# Patient Record
Sex: Female | Born: 1990 | Race: Black or African American | Hispanic: No | Marital: Single | Smoking: Never smoker
Health system: Southern US, Community
[De-identification: ages and names within clinical notes are randomized; demographics above are authoritative.]

## PROBLEM LIST (undated history)

## (undated) DIAGNOSIS — F419 Anxiety disorder, unspecified: Secondary | ICD-10-CM

## (undated) DIAGNOSIS — J45909 Unspecified asthma, uncomplicated: Secondary | ICD-10-CM

## (undated) DIAGNOSIS — T7840XA Allergy, unspecified, initial encounter: Secondary | ICD-10-CM

## (undated) DIAGNOSIS — D649 Anemia, unspecified: Secondary | ICD-10-CM

## (undated) DIAGNOSIS — R569 Unspecified convulsions: Secondary | ICD-10-CM

## (undated) HISTORY — DX: Anemia, unspecified: D64.9

## (undated) HISTORY — DX: Allergy, unspecified, initial encounter: T78.40XA

## (undated) HISTORY — DX: Anxiety disorder, unspecified: F41.9

---

## 2012-09-08 ENCOUNTER — Encounter (HOSPITAL_COMMUNITY): Payer: Self-pay | Admitting: *Deleted

## 2012-09-08 ENCOUNTER — Emergency Department (HOSPITAL_COMMUNITY)
Admission: EM | Admit: 2012-09-08 | Discharge: 2012-09-09 | Disposition: A | Discharge status: Home / Self Care | Payer: Self-pay | Attending: Emergency Medicine | Admitting: Emergency Medicine

## 2012-09-08 DIAGNOSIS — J45909 Unspecified asthma, uncomplicated: Secondary | ICD-10-CM

## 2012-09-08 DIAGNOSIS — R509 Fever, unspecified: Secondary | ICD-10-CM | POA: Insufficient documentation

## 2012-09-08 DIAGNOSIS — J029 Acute pharyngitis, unspecified: Secondary | ICD-10-CM

## 2012-09-08 HISTORY — DX: Unspecified asthma, uncomplicated: J45.909

## 2012-09-08 LAB — RAPID STREP SCREEN (MED CTR MEBANE ONLY): Streptococcus, Group A Screen (Direct): NEGATIVE

## 2012-09-08 NOTE — ED Notes (Signed)
Pt c/o sore throat x 1 week, followed by fever, cough and nasal congestion.  Pt mildly febrile.

## 2012-09-09 MED ORDER — IBUPROFEN 800 MG PO TABS: 800.0000 mg | ORAL_TABLET | Freq: Three times a day (TID) | ORAL | Status: DC

## 2012-09-09 MED ORDER — HYDROCODONE-ACETAMINOPHEN 5-325 MG PO TABS: 1.0000 | ORAL_TABLET | ORAL | Status: DC | PRN

## 2012-09-09 MED ORDER — ALBUTEROL SULFATE HFA 108 (90 BASE) MCG/ACT IN AERS
2.0000 | INHALATION_SPRAY | RESPIRATORY_TRACT | Status: DC | PRN
  Administered 2012-09-09: 2 via RESPIRATORY_TRACT
  Filled 2012-09-09: qty 6.7

## 2012-09-09 MED ORDER — KETOROLAC TROMETHAMINE 60 MG/2ML IM SOLN
60.0000 mg | Freq: Once | INTRAMUSCULAR | Status: AC
  Administered 2012-09-09: 60 mg via INTRAMUSCULAR
  Filled 2012-09-09: qty 2

## 2012-09-09 NOTE — ED Provider Notes (Signed)
History     CSN: [id number]  Arrival date & time 09/08/12  2310   None     Chief Complaint  Patient presents with  . Sore Throat  . Fever    (Consider location/radiation/quality/duration/timing/severity/associated sxs/prior treatment) HPI History provided by pt.   Pt presents w/ c/o sore throat x 1 week.  No relief w/ nyquil.  Associated w/ rhinorrhea and cough as well as fever, max temp 100.5.  No known sick contacts.  Pt has a h/o asthma but is otherwise healthy.  Past Medical History  Diagnosis Date  . Asthma     History reviewed. No pertinent past surgical history.  No family history on file.  History  Substance Use Topics  . Smoking status: Not on file  . Smokeless tobacco: Not on file  . Alcohol Use:     OB History    Grav Para Term Preterm Abortions TAB SAB Ect Mult Living                  Review of Systems  All other systems reviewed and are negative.    Allergies  Banana and Latex  Home Medications   Current Outpatient Rx  Name  Route  Sig  Dispense  Refill  . ALBUTEROL SULFATE HFA 108 (90 BASE) MCG/ACT IN AERS   Inhalation   Inhale 2 puffs into the lungs every 6 (six) hours as needed. asthma         . ADULT MULTIVITAMIN W/MINERALS CH   Oral   Take 1 tablet by mouth daily.         Marland Kitchen HYDROCODONE-ACETAMINOPHEN 5-325 MG PO TABS   Oral   Take 1 tablet by mouth every 4 (four) hours as needed for pain.   20 tablet   0   . IBUPROFEN 800 MG PO TABS   Oral   Take 1 tablet (800 mg total) by mouth 3 (three) times daily.   12 tablet   0     BP 123/70  Pulse 103  Temp 99.7 F (37.6 C) (Oral)  Resp 18  SpO2 98%  LMP 08/24/2012  Physical Exam  Nursing note and vitals reviewed. Constitutional: She is oriented to person, place, and time. She appears well-developed and well-nourished. No distress.  HENT:  Head: Normocephalic and atraumatic.       Mild erythema of soft palate only.  No tonsillar edema or exudate.  No trismus and uvula  mid-line.   Eyes:       Normal appearance  Neck: Normal range of motion.  Cardiovascular: Normal rate and regular rhythm.   Pulmonary/Chest: Effort normal. No respiratory distress.       Breath sounds diminished on right side compared to left.  No wheezing.  Pt does not become dyspneic w/ ambulation.   Musculoskeletal: Normal range of motion.  Neurological: She is alert and oriented to person, place, and time.  Skin: Skin is warm and dry. No rash noted.  Psychiatric: She has a normal mood and affect. Her behavior is normal.    ED Course  Procedures (including critical care time)   Labs Reviewed  RAPID STREP SCREEN   No results found.   1. Viral pharyngitis   2. Asthma       MDM  21yo asthmatic F presents w/ 1 week of sore throat.  Based on centor criteria and negative strep obtained by triage nursing staff, strep pharyngitis unlikely.  Will treat symptomatically.  Pt received IM toradol in ED and  d/c'd home w/ 800mg  ibuprofen and vicodin.  On exam, breath sounds diminished on right side w/out wheezing or cough.  Pt reports SOB yesterday evening, but no CP, SOB, wheezing or cough currently.  Ambulated and pt did become dyspneic.  Provided her with an albuterol inhaler because she is out of hers and referred to [facility].  Return precautions discussed.         Otilio Miu, [place] 09/09/12 [phone]

## 2012-09-09 NOTE — ED Provider Notes (Signed)
Medical screening examination/treatment/procedure(s) were performed by non-physician practitioner and as supervising physician I was immediately available for consultation/collaboration.  Trygg Mantz, MD 09/09/12 0411 

## 2012-11-21 ENCOUNTER — Ambulatory Visit (INDEPENDENT_AMBULATORY_CARE_PROVIDER_SITE_OTHER): Payer: BC Managed Care – PPO | Admitting: Family Medicine

## 2012-11-21 VITALS — BP 107/65 | HR 89 | Temp 98.6°F | Resp 16 | Ht 61.5 in | Wt 135.0 lb

## 2012-11-21 DIAGNOSIS — N898 Other specified noninflammatory disorders of vagina: Secondary | ICD-10-CM

## 2012-11-21 DIAGNOSIS — R1031 Right lower quadrant pain: Secondary | ICD-10-CM

## 2012-11-21 DIAGNOSIS — B373 Candidiasis of vulva and vagina: Secondary | ICD-10-CM

## 2012-11-21 DIAGNOSIS — R110 Nausea: Secondary | ICD-10-CM

## 2012-11-21 DIAGNOSIS — R1032 Left lower quadrant pain: Secondary | ICD-10-CM

## 2012-11-21 DIAGNOSIS — R1110 Vomiting, unspecified: Secondary | ICD-10-CM

## 2012-11-21 DIAGNOSIS — B3731 Acute candidiasis of vulva and vagina: Secondary | ICD-10-CM

## 2012-11-21 DIAGNOSIS — N390 Urinary tract infection, site not specified: Secondary | ICD-10-CM

## 2012-11-21 DIAGNOSIS — K219 Gastro-esophageal reflux disease without esophagitis: Secondary | ICD-10-CM

## 2012-11-21 LAB — POCT CBC
Granulocyte percent: 59.5 %G (ref 37–80)
HCT, POC: 34.9 % — AB (ref 37.7–47.9)
Hemoglobin: 10.7 g/dL — AB (ref 12.2–16.2)
POC Granulocyte: 4.9 (ref 2–6.9)
RBC: 4.14 M/uL (ref 4.04–5.48)

## 2012-11-21 LAB — POCT UA - MICROSCOPIC ONLY: Casts, Ur, LPF, POC: NEGATIVE

## 2012-11-21 LAB — COMPREHENSIVE METABOLIC PANEL
ALT: 9 U/L (ref 0–35)
CO2: 26 mEq/L (ref 19–32)
Sodium: 137 mEq/L (ref 135–145)
Total Bilirubin: 0.7 mg/dL (ref 0.3–1.2)
Total Protein: 7.5 g/dL (ref 6.0–8.3)

## 2012-11-21 LAB — POCT URINALYSIS DIPSTICK
Bilirubin, UA: NEGATIVE
Glucose, UA: NEGATIVE
Nitrite, UA: NEGATIVE
pH, UA: 5.5

## 2012-11-21 LAB — POCT URINE PREGNANCY: Preg Test, Ur: NEGATIVE

## 2012-11-21 LAB — POCT WET PREP WITH KOH
KOH Prep POC: POSITIVE
Trichomonas, UA: NEGATIVE

## 2012-11-21 MED ORDER — ONDANSETRON 8 MG PO TBDP: 8.0000 mg | ORAL_TABLET | Freq: Three times a day (TID) | ORAL | Status: DC | PRN

## 2012-11-21 MED ORDER — OMEPRAZOLE 20 MG PO CPDR: 20.0000 mg | DELAYED_RELEASE_CAPSULE | Freq: Every day | ORAL | Status: DC

## 2012-11-21 MED ORDER — TERCONAZOLE 0.4 % VA CREA: 1.0000 | TOPICAL_CREAM | Freq: Every day | VAGINAL | Status: DC

## 2012-11-21 MED ORDER — FLUCONAZOLE 150 MG PO TABS: 150.0000 mg | ORAL_TABLET | Freq: Once | ORAL | Status: DC

## 2012-11-21 NOTE — Progress Notes (Signed)
[address]   [place], [place]  [address]   [phone]  Subjective:    Patient ID: Melinda Maldonado, female    DOB: 1991/10/18, 22 y.o.   MRN: [id number]  HPIThis 22 y.o. female presents for evaluation of abdominal pain and nausea.  Onset of acute abdominal pain this morning; +severe nausea this morning.  Has suffered with nausea for two weeks.  Breaking out on corners of mouth.  +chest heaviness/tightness for past two days.  Taking Amoxicillin for UTI; has been on four antibiotics for UTI without improvement since 08/2012.  Started on Macrobid but switched to Amoxicillin due to culture results.  Feeling a little better but feeling a little irritated on outside of vaginal region.  Onset of vomiting this morning; started vomiting at 7:00am; vomited x 2 today.  Vomit was a yellowish color; no bloody vomit; no black vomit.  Still having abdominal tightness now but not as severe as this morning; severe abdominal pain for 20 minutes this morning; became very hot with pain.  +diarrhea since starting Amoxicillin; has been taking Amoxicillin sporadically due to various symptoms; having diarrhea intermittently; no diarrhea today; +diarrhea yesterday.  +hot this morning; had to take off clothes and drink something cold.  No fever; no chills.  No headache.  No dysuria, frequency, urgency.  +Vaginal irritation.  Having a gray discharge currently.  +pelvic exam performed by PCP at last visit and swab obtained.  Sexually active; tested for STDs at recent visit by PCP and all negative.  LMP 11-02-13 normal.  Just started on Aviane OCP today; took first pill this morning; started today.  History of GERD; previous medication; after eating has nausea.  Recent visit with Huss[facility] at [facility] for acute symptoms.  +heartburn twice in past week.  Also on a fast currently; fasting since 11/11/12.  PCP:  Haynes Dage.  [facility].   Review of Systems  Constitutional: Positive for diaphoresis and appetite change. Negative for fever and  chills.  Respiratory: Positive for chest tightness. Negative for shortness of breath, wheezing and stridor.   Cardiovascular: Negative for chest pain.  Gastrointestinal: Positive for nausea, vomiting, abdominal pain, diarrhea and abdominal distention. Negative for constipation, blood in stool, anal bleeding and rectal pain.  Genitourinary: Positive for vaginal pain and pelvic pain. Negative for dysuria, urgency, frequency, hematuria, flank pain, vaginal bleeding, vaginal discharge, genital sores and menstrual problem.  Skin: Positive for rash.  Neurological: Negative for dizziness, light-headedness and headaches.        Past Medical History  Diagnosis Date  . Asthma   . Allergy   . Anemia   . Anxiety     No past surgical history on file.  Prior to Admission medications   Medication Sig Start Date End Date Taking? Authorizing Provider  albuterol (PROVENTIL HFA;VENTOLIN HFA) 108 (90 BASE) MCG/ACT inhaler Inhale 2 puffs into the lungs every 6 (six) hours as needed. asthma   Yes Historical Provider, MD  amoxicillin (AMOXIL) 875 MG tablet Take 875 mg by mouth 2 (two) times daily.   Yes Historical Provider, MD  montelukast (SINGULAIR) 10 MG tablet Take 10 mg by mouth at bedtime.   Yes Historical Provider, MD  Multiple Vitamin (MULTIVITAMIN WITH MINERALS) TABS Take 1 tablet by mouth daily.   Yes Historical Provider, MD  Vitamin D, Ergocalciferol, (DRISDOL) 50000 UNITS CAPS Take 50,000 Units by mouth.   Yes Historical Provider, MD    Allergies  Allergen Reactions  . Banana Hives  . Latex Itching  Rash, blistering and burning  . Sulfa Antibiotics     History   Social History  . Marital Status: Single    Spouse Name: N/A    Number of Children: N/A  . Years of Education: N/A   Occupational History  . Not on file.   Social History Main Topics  . Smoking status: Never Smoker   . Smokeless tobacco: Not on file  . Alcohol Use: No  . Drug Use: No  . Sexually Active: Not on  file   Other Topics Concern  . Not on file   Social History Narrative  . No narrative on file    Family History  Problem Relation Age of Onset  . Asthma Sister   . Asthma Daughter   . Cancer Paternal Grandmother     Objective:   Physical Exam  Nursing note and vitals reviewed. Constitutional: She is oriented to person, place, and time. She appears well-developed and well-nourished. No distress.  HENT:  Mouth/Throat: Oropharynx is clear and moist.  Eyes: Conjunctivae normal and EOM are normal. Pupils are equal, round, and reactive to light.  Neck: Normal range of motion. Neck supple. No thyromegaly present.  Cardiovascular: Normal rate, regular rhythm and normal heart sounds.   No murmur heard. Pulmonary/Chest: Effort normal and breath sounds normal. She has no wheezes. She has no rales.  Abdominal: Soft. Bowel sounds are normal. She exhibits no distension and no mass. There is no hepatosplenomegaly. There is tenderness in the right lower quadrant and left lower quadrant. There is no rigidity, no rebound, no guarding and no CVA tenderness.  Lymphadenopathy:    She has no cervical adenopathy.  Neurological: She is alert and oriented to person, place, and time. No cranial nerve deficit. She exhibits normal muscle tone. Coordination normal.  Skin: She is not diaphoretic.       Mild erythema B corners vermilion border of lips.  Psychiatric: She has a normal mood and affect. Her behavior is normal.   Results for orders placed in visit on 11/21/12  POCT CBC      Component Value Range   WBC 8.2  4.6 - 10.2 K/uL   Lymph, poc 2.9  0.6 - 3.4   POC LYMPH PERCENT 34.9  10 - 50 %L   MID (cbc) 0.5  0 - 0.9   POC MID % 5.6  0 - 12 %M   POC Granulocyte 4.9  2 - 6.9   Granulocyte percent 59.5  37 - 80 %G   RBC 4.14  4.04 - 5.48 M/uL   Hemoglobin 10.7 (*) 12.2 - 16.2 g/dL   HCT, POC 45.4 (*) 09.8 - 47.9 %   MCV 84.3  80 - 97 fL   MCH, POC 25.8 (*) 27 - 31.2 pg   MCHC 30.7 (*) 31.8 -  35.4 g/dL   RDW, POC 11.9     Platelet Count, POC 386  142 - 424 K/uL   MPV 8.9  0 - 99.8 fL  POCT URINE PREGNANCY      Component Value Range   Preg Test, Ur Negative    POCT URINALYSIS DIPSTICK      Component Value Range   Color, UA yellow     Clarity, UA clear     Glucose, UA neg     Bilirubin, UA neg     Ketones, UA neg     Spec Grav, UA >=1.030     Blood, UA neg     pH, UA 5.5  Protein, UA neg     Urobilinogen, UA 0.2     Nitrite, UA neg     Leukocytes, UA small (1+)    POCT UA - MICROSCOPIC ONLY      Component Value Range   WBC, Ur, HPF, POC 2-4     RBC, urine, microscopic 0-2     Bacteria, U Microscopic small     Mucus, UA small     Epithelial cells, urine per micros 3-6     Crystals, Ur, HPF, POC neg     Casts, Ur, LPF, POC neg     Yeast, UA neg    POCT WET PREP WITH KOH      Component Value Range   Trichomonas, UA Negative     Clue Cells Wet Prep HPF POC negative     Epithelial Wet Prep HPF POC 3-6     Yeast Wet Prep HPF POC positive     Bacteria Wet Prep HPF POC 1+     RBC Wet Prep HPF POC 0-1     WBC Wet Prep HPF POC 3-6     KOH Prep POC Positive          Assessment & Plan:   1. Nausea  POCT CBC, POCT urine pregnancy, POCT urinalysis dipstick, POCT UA - Microscopic Only, Urine culture, Comprehensive metabolic panel, ondansetron (ZOFRAN-ODT) 8 MG disintegrating tablet  2. Vaginal itching  POCT Wet Prep with KOH  3. Candidiasis of vagina  fluconazole (DIFLUCAN) 150 MG tablet, terconazole (TERAZOL 7) 0.4 % vaginal cream  4. UTI (lower urinary tract infection)    5. Vomiting    6. GERD (gastroesophageal reflux disease)  omeprazole (PRILOSEC) 20 MG capsule  7. Abdominal pain, acute, bilateral lower quadrant      1. Abdominal pain/pelvic pain:  New.  Benign exam.  With associated nausea and vomiting.  BRAT diet.  Urine pregnancy negative and WBC count normal.  RTC for acute worsening. 2.  Nausea with vomiting:  New; worsening with initiation of OCP this  morning.  BRAT diet, hydration.  Rx for Zofran 8mg  PRN provided.  RTC for persistence.  Recommend switching OCP to after breakfast; if nausea persists, recommend switching OCP to qhs. 3.  Candidiasis vaginal:  New.  Rx for Diflucan and Terazol provided; likely secondary to multiple antibiotics. S/p recent STD screening by PCP by report and negative. 4.  UTI:  New to this provider.  Persistent since 08/2012 per patient; send urine culture; complete Amoxicillin. 5.  GERD:  New.  Rx for Omeprazole 20mg  daily for next 2-4 weeks.     Meds ordered this encounter  Medications  . montelukast (SINGULAIR) 10 MG tablet    Sig: Take 10 mg by mouth at bedtime.  Marland Kitchen amoxicillin (AMOXIL) 875 MG tablet    Sig: Take 875 mg by mouth 2 (two) times daily.  . Vitamin D, Ergocalciferol, (DRISDOL) 50000 UNITS CAPS    Sig: Take 50,000 Units by mouth.  . fluconazole (DIFLUCAN) 150 MG tablet    Sig: Take 1 tablet (150 mg total) by mouth once. Repeat if needed    Dispense:  2 tablet    Refill:  0  . terconazole (TERAZOL 7) 0.4 % vaginal cream    Sig: Place 1 applicator vaginally at bedtime.    Dispense:  45 g    Refill:  0  . omeprazole (PRILOSEC) 20 MG capsule    Sig: Take 1 capsule (20 mg total) by mouth daily.    Dispense:  30 capsule    Refill:  3  . ondansetron (ZOFRAN-ODT) 8 MG disintegrating tablet    Sig: Take 1 tablet (8 mg total) by mouth every 8 (eight) hours as needed for nausea.    Dispense:  30 tablet    Refill:  0

## 2012-11-21 NOTE — Patient Instructions (Addendum)
1. Nausea  POCT CBC, POCT urine pregnancy, POCT urinalysis dipstick, POCT UA - Microscopic Only, Urine culture, Comprehensive metabolic panel, ondansetron (ZOFRAN-ODT) 8 MG disintegrating tablet  2. Vaginal itching  POCT Wet Prep with KOH  3. Candidiasis of vagina  fluconazole (DIFLUCAN) 150 MG tablet, terconazole (TERAZOL 7) 0.4 % vaginal cream  4. UTI (lower urinary tract infection)    5. Vomiting    6. GERD (gastroesophageal reflux disease)  omeprazole (PRILOSEC) 20 MG capsule  7. Abdominal pain, acute, bilateral lower quadrant

## 2012-11-22 LAB — URINE CULTURE

## 2012-12-08 ENCOUNTER — Encounter (HOSPITAL_COMMUNITY): Payer: Self-pay

## 2012-12-08 ENCOUNTER — Emergency Department (HOSPITAL_COMMUNITY): Payer: Self-pay

## 2012-12-08 ENCOUNTER — Emergency Department (HOSPITAL_COMMUNITY)
Admission: EM | Admit: 2012-12-08 | Discharge: 2012-12-08 | Disposition: A | Discharge status: Home / Self Care | Payer: Self-pay | Attending: Emergency Medicine | Admitting: Emergency Medicine

## 2012-12-08 DIAGNOSIS — R112 Nausea with vomiting, unspecified: Secondary | ICD-10-CM | POA: Insufficient documentation

## 2012-12-08 DIAGNOSIS — Z79899 Other long term (current) drug therapy: Secondary | ICD-10-CM | POA: Insufficient documentation

## 2012-12-08 DIAGNOSIS — M545 Low back pain, unspecified: Secondary | ICD-10-CM | POA: Insufficient documentation

## 2012-12-08 DIAGNOSIS — N938 Other specified abnormal uterine and vaginal bleeding: Secondary | ICD-10-CM | POA: Insufficient documentation

## 2012-12-08 DIAGNOSIS — J45909 Unspecified asthma, uncomplicated: Secondary | ICD-10-CM | POA: Insufficient documentation

## 2012-12-08 DIAGNOSIS — Z8659 Personal history of other mental and behavioral disorders: Secondary | ICD-10-CM | POA: Insufficient documentation

## 2012-12-08 DIAGNOSIS — Z3202 Encounter for pregnancy test, result negative: Secondary | ICD-10-CM | POA: Insufficient documentation

## 2012-12-08 DIAGNOSIS — R109 Unspecified abdominal pain: Secondary | ICD-10-CM

## 2012-12-08 DIAGNOSIS — R1012 Left upper quadrant pain: Secondary | ICD-10-CM | POA: Insufficient documentation

## 2012-12-08 DIAGNOSIS — M549 Dorsalgia, unspecified: Secondary | ICD-10-CM

## 2012-12-08 DIAGNOSIS — Z862 Personal history of diseases of the blood and blood-forming organs and certain disorders involving the immune mechanism: Secondary | ICD-10-CM | POA: Insufficient documentation

## 2012-12-08 DIAGNOSIS — N949 Unspecified condition associated with female genital organs and menstrual cycle: Secondary | ICD-10-CM | POA: Insufficient documentation

## 2012-12-08 LAB — URINALYSIS, ROUTINE W REFLEX MICROSCOPIC
Ketones, ur: NEGATIVE mg/dL
Leukocytes, UA: NEGATIVE
Nitrite: NEGATIVE
Protein, ur: NEGATIVE mg/dL
pH: 6 (ref 5.0–8.0)

## 2012-12-08 LAB — GC/CHLAMYDIA PROBE AMP: CT Probe RNA: NEGATIVE

## 2012-12-08 LAB — CBC WITH DIFFERENTIAL/PLATELET
Basophils Absolute: 0 10*3/uL (ref 0.0–0.1)
Basophils Relative: 0 % (ref 0–1)
MCHC: 32.5 g/dL (ref 30.0–36.0)
Neutro Abs: 3.6 10*3/uL (ref 1.7–7.7)
Neutrophils Relative %: 49 % (ref 43–77)
RDW: 14.9 % (ref 11.5–15.5)

## 2012-12-08 LAB — WET PREP, GENITAL: Yeast Wet Prep HPF POC: NONE SEEN

## 2012-12-08 LAB — COMPREHENSIVE METABOLIC PANEL
ALT: 10 U/L (ref 0–35)
Albumin: 3.6 g/dL (ref 3.5–5.2)
Alkaline Phosphatase: 68 U/L (ref 39–117)
BUN: 12 mg/dL (ref 6–23)
Potassium: 3.8 mEq/L (ref 3.5–5.1)
Sodium: 140 mEq/L (ref 135–145)
Total Protein: 6.9 g/dL (ref 6.0–8.3)

## 2012-12-08 LAB — URINE MICROSCOPIC-ADD ON

## 2012-12-08 MED ORDER — GI COCKTAIL ~~LOC~~
30.0000 mL | Freq: Once | ORAL | Status: AC
  Administered 2012-12-08: 30 mL via ORAL
  Filled 2012-12-08: qty 30

## 2012-12-08 MED ORDER — HYDROCODONE-ACETAMINOPHEN 5-325 MG PO TABS: 1.0000 | ORAL_TABLET | ORAL | Status: DC | PRN

## 2012-12-08 MED ORDER — PROMETHAZINE HCL 25 MG PO TABS: 25.0000 mg | ORAL_TABLET | Freq: Four times a day (QID) | ORAL | Status: DC | PRN

## 2012-12-08 MED ORDER — IBUPROFEN 800 MG PO TABS
800.0000 mg | ORAL_TABLET | Freq: Once | ORAL | Status: AC
  Administered 2012-12-08: 800 mg via ORAL
  Filled 2012-12-08: qty 1

## 2012-12-08 MED ORDER — FAMOTIDINE 20 MG PO TABS: 20.0000 mg | ORAL_TABLET | Freq: Two times a day (BID) | ORAL | Status: DC

## 2012-12-08 NOTE — ED Provider Notes (Signed)
Medical screening examination/treatment/procedure(s) were performed by non-physician practitioner and as supervising physician I was immediately available for consultation/collaboration.  Sunnie Nielsen, MD 12/08/12 [phone]

## 2012-12-08 NOTE — ED Notes (Signed)
Pt states that she has been having heavy menstrual bleeding and that it doesn't feel like a normal menstrual cycle. Pt states that the pain is in her upper right back, and lower right abdomen. Pt states she has to sleep on pillows to aleviate pain.

## 2012-12-08 NOTE — ED Notes (Signed)
Patient presents with bilateral flank pain, lower back pain and abdominal pain x 2 weeks. Was treated by PCP for UTI about 1.5 weeks ago with antibiotics, norco and zofran. Denies any current urinary sx (frequency, urgency, discharge, odor). No fever, sweats or chills. LMP 12/03/12

## 2012-12-08 NOTE — ED Provider Notes (Signed)
History     CSN: [id number]  Arrival date & time 12/08/12  0111   First MD Initiated Contact with Patient 12/08/12 0116      Chief Complaint  Patient presents with  . Abdominal Pain  . Flank Pain    (Consider location/radiation/quality/duration/timing/severity/associated sxs/prior treatment) HPI History provided by pt.   Pt has had intermittent, sharp, non-radiating pain in epigastrium for the past 6 days.  Moves to either one of her sides when she is laying on that side in bed.  Not affected by eating.  No urinary sx or change in bowels. Has had constant pain in mid-back that started around the same time.  No aggravating/alleviating factors.  No associated fever, cough, CP or LE edema/pain but has had worse than baseline SOB for the past 2 weeks.  No RF for PE.  Denies trauma/heavy lifting.  Has been experiencing am and evening nausea for the past 4 month; single episode of vomiting several days ago.  Has been evaluated by her PCP and [facility] and etiology unknown. 5-6 days ago she developed heavy vaginal bleeding.  Has to change her pad/tampon q2-3 hours.  Denies fever, diarrhea, urinary and other vaginal sx.  Has felt lightheaded w/ standing recently.  Missed her period in December, which has never happened in the past.  Was started on birth control two weeks ago, took a single pill and then forgot to take anymore of them.    Past Medical History  Diagnosis Date  . Asthma   . Allergy   . Anemia   . Anxiety     History reviewed. No pertinent past surgical history.  Family History  Problem Relation Age of Onset  . Asthma Sister   . Asthma Daughter   . Cancer Paternal Grandmother     History  Substance Use Topics  . Smoking status: Never Smoker   . Smokeless tobacco: Not on file  . Alcohol Use: No    OB History    Grav Para Term Preterm Abortions TAB SAB Ect Mult Living                  Review of Systems  All other systems reviewed and are  negative.    Allergies  Banana; Latex; and Sulfa antibiotics  Home Medications   Current Outpatient Rx  Name  Route  Sig  Dispense  Refill  . ALBUTEROL SULFATE HFA 108 (90 BASE) MCG/ACT IN AERS   Inhalation   Inhale 2 puffs into the lungs every 6 (six) hours as needed. asthma         . HYDROCODONE-ACETAMINOPHEN 5-325 MG PO TABS   Oral   Take 1 tablet by mouth every 6 (six) hours as needed. For pain         . MONTELUKAST SODIUM 10 MG PO TABS   Oral   Take 10 mg by mouth at bedtime.         . ADULT MULTIVITAMIN W/MINERALS CH   Oral   Take 1 tablet by mouth daily.         Marland Kitchen OMEPRAZOLE 20 MG PO CPDR   Oral   Take 1 capsule (20 mg total) by mouth daily.   30 capsule   3   . ONDANSETRON 8 MG PO TBDP   Oral   Take 1 tablet (8 mg total) by mouth every 8 (eight) hours as needed for nausea.   30 tablet   0   . VITAMIN D (ERGOCALCIFEROL) 50000  UNITS PO CAPS   Oral   Take 50,000 Units by mouth every 7 (seven) days. On Tuesdays           BP 115/61  Pulse 68  Temp 98.3 F (36.8 C) (Oral)  Resp 16  SpO2 100%  LMP 12/03/2012  Physical Exam  Nursing note and vitals reviewed. Constitutional: She is oriented to person, place, and time. She appears well-developed and well-nourished. No distress.  HENT:  Head: Normocephalic and atraumatic.  Eyes:       Normal appearance  Neck: Normal range of motion.  Cardiovascular: Normal rate and regular rhythm.   Pulmonary/Chest: Effort normal and breath sounds normal. No respiratory distress.       Denies pleuritic pain in right mid-back  Abdominal: Soft. Bowel sounds are normal. She exhibits no distension and no mass. There is no rebound and no guarding.       Mild, diffuse ttp, most prominent in epigastrium on initial exam.  Isolated LUQ ttp on repeat exam.   Genitourinary:       No CVA tenderness.  Nml external genitalia.  Small amount of vaginal bleeding.  Cervix closed and appears nml.  No adnexal or cervical motion  tenderess.   Musculoskeletal: Normal range of motion.       Mild tenderness of thoracic spine and right paraspinal muscles.  Pain w/ twisting torso.  No LE edema/ttp.   Neurological: She is alert and oriented to person, place, and time.  Skin: Skin is warm and dry. No rash noted.  Psychiatric: She has a normal mood and affect. Her behavior is normal.    ED Course  Procedures (including critical care time)  Labs Reviewed  URINALYSIS, ROUTINE W REFLEX MICROSCOPIC - Abnormal; Notable for the following:    Hgb urine dipstick MODERATE (*)     All other components within normal limits  CBC WITH DIFFERENTIAL - Abnormal; Notable for the following:    RBC 3.84 (*)     Hemoglobin 10.2 (*)     HCT 31.4 (*)     All other components within normal limits  COMPREHENSIVE METABOLIC PANEL - Abnormal; Notable for the following:    Total Bilirubin 0.2 (*)     All other components within normal limits  WET PREP, GENITAL - Abnormal; Notable for the following:    Clue Cells Wet Prep HPF POC FEW (*)     WBC, Wet Prep HPF POC MODERATE (*)     All other components within normal limits  URINE MICROSCOPIC-ADD ON - Abnormal; Notable for the following:    Squamous Epithelial / LPF FEW (*)     All other components within normal limits  LIPASE, BLOOD  POCT PREGNANCY, URINE  GC/CHLAMYDIA PROBE AMP   Dg Chest 2 View  12/08/2012  *RADIOLOGY REPORT*  Clinical Data: Shortness of breath and asthma.  CHEST - 2 VIEW  Comparison: None.  Findings: Normal inspiration. The heart size and pulmonary vascularity are normal. The lungs appear clear and expanded without focal air space disease or consolidation. No blunting of the costophrenic angles.  No pneumothorax.  Mediastinal contours are intact.  IMPRESSION: No evidence of active pulmonary disease.   Original Report Authenticated By: Burman Nieves, M.D.      1. Abdominal pain   2. Back pain   3. Dysfunctional uterine bleeding   4. Nausea and vomiting       MDM   21yo F w/ h/o asthma and anemia, presents w/ multiple complaints including constant mid-back  and intermittent epigastric pain x several days, am and evening nausea x 4 months and irregular periods, currently w/ atypically heavy vaginal bleeding.  Doubt PE; no risk factors, back pain non-pleuritic and reproducible w/ palpation and twisting of torso, no signs of DVT and VS w/in nml range.  CXR neg for pneumonia.  Will treat symptomatically for muscle strain.  Mild LUQ tenderness but otherwise benign abd on exam.  Labs unremarkable.  Likely has gastritis.  Low suspicion for cholelithiasis based on description of pain and no RUQ tenderness.  Has taken omeprazole in past w/out relief.  Prescribed pepcid and promethazine.  Referred to GI for chronic nausea.  Nml genitalia exam w/ exception of small amt of vaginal bleeding.  Pt reports that bleeding has lightened today so will not prescribe provera.  Advised close f/u with gynecologist.  Return precautions discussed. 5:34 AM        Otilio Miu, PA-C 12/08/12 [phone]

## 2012-12-08 NOTE — ED Notes (Signed)
Pt unable to void at this time. 

## 2013-01-05 ENCOUNTER — Inpatient Hospital Stay (HOSPITAL_COMMUNITY)
Admission: AD | Admit: 2013-01-05 | Discharge: 2013-01-05 | Disposition: A | Discharge status: Home / Self Care | Payer: BC Managed Care – PPO | Source: Ambulatory Visit | Attending: Obstetrics and Gynecology | Admitting: Obstetrics and Gynecology

## 2013-01-05 ENCOUNTER — Inpatient Hospital Stay (HOSPITAL_COMMUNITY): Payer: BC Managed Care – PPO

## 2013-01-05 ENCOUNTER — Encounter (HOSPITAL_COMMUNITY): Payer: Self-pay | Admitting: *Deleted

## 2013-01-05 DIAGNOSIS — R109 Unspecified abdominal pain: Secondary | ICD-10-CM

## 2013-01-05 DIAGNOSIS — N926 Irregular menstruation, unspecified: Secondary | ICD-10-CM

## 2013-01-05 LAB — URINE MICROSCOPIC-ADD ON

## 2013-01-05 LAB — URINALYSIS, ROUTINE W REFLEX MICROSCOPIC
Nitrite: NEGATIVE
Protein, ur: NEGATIVE mg/dL
Specific Gravity, Urine: <1.005 — ABNORMAL LOW (ref 1.005–1.030)
Urobilinogen, UA: 0.2 mg/dL (ref 0.0–1.0)

## 2013-01-05 LAB — CBC
MCHC: 32.1 g/dL (ref 30.0–36.0)
Platelets: 388 10*3/uL (ref 150–400)
RDW: 14.7 % (ref 11.5–15.5)
WBC: 8.1 10*3/uL (ref 4.0–10.5)

## 2013-01-05 LAB — POCT PREGNANCY, URINE: Preg Test, Ur: NEGATIVE

## 2013-01-05 MED ORDER — KETOROLAC TROMETHAMINE 60 MG/2ML IM SOLN
60.0000 mg | Freq: Once | INTRAMUSCULAR | Status: AC
  Administered 2013-01-05: 60 mg via INTRAMUSCULAR
  Filled 2013-01-05: qty 2

## 2013-01-05 NOTE — MAU Note (Signed)
Vaginal bleeding for the last 8 days with pelvic pain and back pain. HPT negative. Sent from urgent care to be evaluated.

## 2013-01-05 NOTE — MAU Provider Note (Signed)
History     CSN: [id number]  Arrival date and time: 01/05/13 2016   None     Chief Complaint  Patient presents with  . Vaginal Bleeding  . Abdominal Cramping   HPI  Melinda Maldonado is a 22 y.o. [id number] who presents today with vaginal bleeding X 8 days, and cramps. She was sent over from urgent care. She states her primary doctor has sent her to the neurologist and the gastroenterologist to see if the lower abdominal pain and back pain could have been attributed to either of those areas.   Past Medical History  Diagnosis Date  . Asthma   . Allergy   . Anemia   . Anxiety     History reviewed. No pertinent past surgical history.  Family History  Problem Relation Age of Onset  . Asthma Sister   . Asthma Daughter   . Cancer Paternal Grandmother     History  Substance Use Topics  . Smoking status: Never Smoker   . Smokeless tobacco: Not on file  . Alcohol Use: No    Allergies:  Allergies  Allergen Reactions  . Keflex (Cephalexin) Anaphylaxis  . Banana Hives  . Eggs Or Egg-Derived Products Other (See Comments)    Body gets hot  . Latex Itching    Rash, blistering and burning  . Sulfa Antibiotics     Prescriptions prior to admission  Medication Sig Dispense Refill  . albuterol (PROVENTIL HFA;VENTOLIN HFA) 108 (90 BASE) MCG/ACT inhaler Inhale 2 puffs into the lungs every 6 (six) hours as needed. asthma      . aspirin 325 MG tablet Take 325 mg by mouth daily.      Marland Kitchen ibuprofen (ADVIL,MOTRIN) 400 MG tablet Take 400 mg by mouth every 6 (six) hours as needed for pain.      . montelukast (SINGULAIR) 10 MG tablet Take 10 mg by mouth at bedtime.      Marland Kitchen omeprazole (PRILOSEC) 20 MG capsule Take 1 capsule (20 mg total) by mouth daily.  30 capsule  3  . Vitamin D, Ergocalciferol, (DRISDOL) 50000 UNITS CAPS Take 50,000 Units by mouth every 7 (seven) days. On Tuesdays      . Multiple Vitamin (MULTIVITAMIN WITH MINERALS) TABS Take 1 tablet by mouth daily.      . ondansetron  (ZOFRAN-ODT) 8 MG disintegrating tablet Take 1 tablet (8 mg total) by mouth every 8 (eight) hours as needed for nausea.  30 tablet  0  . [DISCONTINUED] famotidine (PEPCID) 20 MG tablet Take 1 tablet (20 mg total) by mouth 2 (two) times daily.  20 tablet  0  . [DISCONTINUED] HYDROcodone-acetaminophen (NORCO/VICODIN) 5-325 MG per tablet Take 1 tablet by mouth every 6 (six) hours as needed. For pain      . [DISCONTINUED] HYDROcodone-acetaminophen (NORCO/VICODIN) 5-325 MG per tablet Take 1 tablet by mouth every 4 (four) hours as needed for pain.  20 tablet  0  . [DISCONTINUED] promethazine (PHENERGAN) 25 MG tablet Take 1 tablet (25 mg total) by mouth every 6 (six) hours as needed for nausea.  20 tablet  0    ROS Physical Exam   Blood pressure 116/82, pulse 86, temperature 97.9 F (36.6 C), temperature source Oral, resp. rate 18, height 4' 11.5" (1.511 m), weight 137 lb 8 oz (62.37 kg), last menstrual period 12/16/2012.  Physical Exam  Constitutional: She is oriented to person, place, and time. She appears well-developed and well-nourished. No distress.  Cardiovascular: Normal rate.   Respiratory: Effort normal.  Neurological: She is alert and oriented to person, place, and time.  Skin: Skin is warm and dry.  Psychiatric: She has a normal mood and affect.    MAU Course  Procedures  Results for orders placed during the hospital encounter of 01/05/13 (from the past 24 hour(s))  URINALYSIS, ROUTINE W REFLEX MICROSCOPIC     Status: Abnormal   Collection Time    01/05/13  8:20 PM      Result Value Range   Color, Urine YELLOW  YELLOW   APPearance CLEAR  CLEAR   Specific Gravity, Urine <1.005 (*) 1.005 - 1.030   pH 6.0  5.0 - 8.0   Glucose, UA NEGATIVE  NEGATIVE mg/dL   Hgb urine dipstick NEGATIVE  NEGATIVE   Bilirubin Urine NEGATIVE  NEGATIVE   Ketones, ur NEGATIVE  NEGATIVE mg/dL   Protein, ur NEGATIVE  NEGATIVE mg/dL   Urobilinogen, UA 0.2  0.0 - 1.0 mg/dL   Nitrite NEGATIVE  NEGATIVE    Leukocytes, UA MODERATE (*) NEGATIVE  URINE MICROSCOPIC-ADD ON     Status: Abnormal   Collection Time    01/05/13  8:20 PM      Result Value Range   Squamous Epithelial / LPF RARE  RARE   WBC, UA 3-6  <3 WBC/hpf   RBC / HPF 0-2  <3 RBC/hpf   Bacteria, UA FEW (*) RARE  CBC     Status: Abnormal   Collection Time    01/05/13  8:30 PM      Result Value Range   WBC 8.1  4.0 - 10.5 K/uL   RBC 3.88  3.87 - 5.11 MIL/uL   Hemoglobin 10.2 (*) 12.0 - 15.0 g/dL   HCT 47.8 (*) 29.5 - 62.1 %   MCV 82.0  78.0 - 100.0 fL   MCH 26.3  26.0 - 34.0 pg   MCHC 32.1  30.0 - 36.0 g/dL   RDW 30.8  65.7 - 84.6 %   Platelets 388  150 - 400 K/uL  POCT PREGNANCY, URINE     Status: None   Collection Time    01/05/13  8:45 PM      Result Value Range   Preg Test, Ur NEGATIVE  NEGATIVE   *RADIOLOGY REPORT*  Clinical Data: Abnormal uterine bleeding.  TRANSABDOMINAL AND TRANSVAGINAL ULTRASOUND OF PELVIS  Technique: Both transabdominal and transvaginal ultrasound  examinations of the pelvis were performed. Transabdominal technique  was performed for global imaging of the pelvis including uterus,  ovaries, adnexal regions, and pelvic cul-de-sac.  It was necessary to proceed with endovaginal exam following the  transabdominal exam to visualize the uterus and endometrium.  Comparison: None  Findings:  Uterus: Uterus is normal and size and echotexture measuring 7.9 x  3.8 x 4.9 cm.  Endometrium: Uniformly thin at 2 mm.  Right ovary: Normal in size with small follicles measuring 3.1 x  2.1 x 2.6 cm.  Left ovary: Normal in size of small follicles measuring 2.3 x 1.4 x  1.6 cm.  Other findings: No free fluid  IMPRESSION:  Normal uterus and ovaries.  Original Report Authenticated By: Genevive Bi, M.D.   Assessment and Plan   1. Irregular menstrual cycle    FU with PCP Keep a menstrual calendar   Tawnya Crook 01/05/2013, 9:53 PM

## 2013-01-07 LAB — URINE CULTURE: Colony Count: 6000

## 2013-01-09 NOTE — MAU Provider Note (Signed)
Attestation of Attending Supervision of Advanced Practitioner: Evaluation and management procedures were performed by the PA/NP/CNM/OB Fellow under my supervision/collaboration. Chart reviewed and agree with management and plan.  FERGUSON,JOHN V 01/09/2013 8:40 PM   

## 2013-03-31 ENCOUNTER — Emergency Department (HOSPITAL_BASED_OUTPATIENT_CLINIC_OR_DEPARTMENT_OTHER)
Admission: EM | Admit: 2013-03-31 | Discharge: 2013-04-01 | Disposition: A | Discharge status: Home / Self Care | Payer: BC Managed Care – PPO | Attending: Emergency Medicine | Admitting: Emergency Medicine

## 2013-03-31 ENCOUNTER — Encounter (HOSPITAL_BASED_OUTPATIENT_CLINIC_OR_DEPARTMENT_OTHER): Payer: Self-pay

## 2013-03-31 DIAGNOSIS — Z79899 Other long term (current) drug therapy: Secondary | ICD-10-CM | POA: Insufficient documentation

## 2013-03-31 DIAGNOSIS — Z3202 Encounter for pregnancy test, result negative: Secondary | ICD-10-CM | POA: Insufficient documentation

## 2013-03-31 DIAGNOSIS — R102 Pelvic and perineal pain: Secondary | ICD-10-CM

## 2013-03-31 DIAGNOSIS — Z91040 Latex allergy status: Secondary | ICD-10-CM | POA: Insufficient documentation

## 2013-03-31 DIAGNOSIS — Z7982 Long term (current) use of aspirin: Secondary | ICD-10-CM | POA: Insufficient documentation

## 2013-03-31 DIAGNOSIS — F411 Generalized anxiety disorder: Secondary | ICD-10-CM | POA: Insufficient documentation

## 2013-03-31 DIAGNOSIS — Z862 Personal history of diseases of the blood and blood-forming organs and certain disorders involving the immune mechanism: Secondary | ICD-10-CM | POA: Insufficient documentation

## 2013-03-31 DIAGNOSIS — J45909 Unspecified asthma, uncomplicated: Secondary | ICD-10-CM | POA: Insufficient documentation

## 2013-03-31 DIAGNOSIS — N949 Unspecified condition associated with female genital organs and menstrual cycle: Secondary | ICD-10-CM | POA: Insufficient documentation

## 2013-03-31 DIAGNOSIS — R110 Nausea: Secondary | ICD-10-CM | POA: Insufficient documentation

## 2013-03-31 LAB — URINALYSIS, ROUTINE W REFLEX MICROSCOPIC
Glucose, UA: NEGATIVE mg/dL
Hgb urine dipstick: NEGATIVE
Ketones, ur: NEGATIVE mg/dL
Protein, ur: NEGATIVE mg/dL
Urobilinogen, UA: 0.2 mg/dL (ref 0.0–1.0)

## 2013-03-31 LAB — URINE MICROSCOPIC-ADD ON

## 2013-03-31 LAB — PREGNANCY, URINE: Preg Test, Ur: NEGATIVE

## 2013-03-31 MED ORDER — SODIUM CHLORIDE 0.9 % IV BOLUS (SEPSIS)
1000.0000 mL | Freq: Once | INTRAVENOUS | Status: AC
  Administered 2013-03-31: 1000 mL via INTRAVENOUS

## 2013-03-31 NOTE — ED Notes (Signed)
C/o lower abd/pelvic pain x 2 days-nausea x 4 days-denies urinary s/s and vaginal d/c

## 2013-04-01 ENCOUNTER — Ambulatory Visit (HOSPITAL_BASED_OUTPATIENT_CLINIC_OR_DEPARTMENT_OTHER)
Admission: RE | Admit: 2013-04-01 | Discharge: 2013-04-01 | Disposition: A | Discharge status: Home / Self Care | Payer: BC Managed Care – PPO | Source: Ambulatory Visit | Attending: Emergency Medicine | Admitting: Emergency Medicine

## 2013-04-01 ENCOUNTER — Other Ambulatory Visit (HOSPITAL_BASED_OUTPATIENT_CLINIC_OR_DEPARTMENT_OTHER): Payer: Self-pay | Admitting: Emergency Medicine

## 2013-04-01 ENCOUNTER — Ambulatory Visit (HOSPITAL_BASED_OUTPATIENT_CLINIC_OR_DEPARTMENT_OTHER): Payer: BC Managed Care – PPO

## 2013-04-01 DIAGNOSIS — R109 Unspecified abdominal pain: Secondary | ICD-10-CM | POA: Insufficient documentation

## 2013-04-01 DIAGNOSIS — R102 Pelvic and perineal pain: Secondary | ICD-10-CM

## 2013-04-01 DIAGNOSIS — N949 Unspecified condition associated with female genital organs and menstrual cycle: Secondary | ICD-10-CM | POA: Insufficient documentation

## 2013-04-01 LAB — COMPREHENSIVE METABOLIC PANEL
ALT: 10 U/L (ref 0–35)
AST: 23 U/L (ref 0–37)
CO2: 24 mEq/L (ref 19–32)
Calcium: 9.4 mg/dL (ref 8.4–10.5)
Creatinine, Ser: 0.8 mg/dL (ref 0.50–1.10)
GFR calc non Af Amer: >90 mL/min (ref >90)
Sodium: 140 mEq/L (ref 135–145)
Total Protein: 6.8 g/dL (ref 6.0–8.3)

## 2013-04-01 LAB — CBC WITH DIFFERENTIAL/PLATELET
Basophils Absolute: 0 10*3/uL (ref 0.0–0.1)
Basophils Relative: 0 % (ref 0–1)
Eosinophils Absolute: 0.3 10*3/uL (ref 0.0–0.7)
Eosinophils Relative: 3 % (ref 0–5)
HCT: 30.3 % — ABNORMAL LOW (ref 36.0–46.0)
Lymphocytes Relative: 34 % (ref 12–46)
MCH: 26.7 pg (ref 26.0–34.0)
MCHC: 32.3 g/dL (ref 30.0–36.0)
MCV: 82.6 fL (ref 78.0–100.0)
Monocytes Absolute: 1.1 10*3/uL — ABNORMAL HIGH (ref 0.1–1.0)
Platelets: 353 10*3/uL (ref 150–400)
RDW: 14.2 % (ref 11.5–15.5)
WBC: 9.3 10*3/uL (ref 4.0–10.5)

## 2013-04-01 LAB — GC/CHLAMYDIA PROBE AMP: GC Probe RNA: NEGATIVE

## 2013-04-01 LAB — WET PREP, GENITAL
Trich, Wet Prep: NONE SEEN
Yeast Wet Prep HPF POC: NONE SEEN

## 2013-04-01 MED ORDER — AZITHROMYCIN 1 G PO PACK
2.0000 g | PACK | Freq: Once | ORAL | Status: AC
  Administered 2013-04-01: 2 g via ORAL
  Filled 2013-04-01: qty 2

## 2013-04-01 NOTE — ED Provider Notes (Signed)
History     CSN: [id number]  Arrival date & time 03/31/13  2236   First MD Initiated Contact with Patient 04/01/13 0033      Chief Complaint  Patient presents with  . Abdominal Pain    (Consider location/radiation/quality/duration/timing/severity/associated sxs/prior treatment) HPI This is a 22 year old female with a two-day history of pelvic pain. The pain in initially was in the right suprapubic region but is now more central. It is moderate in severity. It comes and goes. It is sharp in nature. It is somewhat changed with body position but is not significantly changed with movement or ambulation. She has been nauseated but has not had vomiting or diarrhea. She denies vaginal bleeding or discharge currently although she did have 3 days of vaginal bleeding starting May 13 which was not at the time of her expected period. She denies dysuria or hematuria. She denies fevers or chills.  Past Medical History  Diagnosis Date  . Asthma   . Allergy   . Anemia   . Anxiety     History reviewed. No pertinent past surgical history.  Family History  Problem Relation Age of Onset  . Asthma Sister   . Asthma Daughter   . Cancer Paternal Grandmother     History  Substance Use Topics  . Smoking status: Never Smoker   . Smokeless tobacco: Not on file  . Alcohol Use: No    OB History   Grav Para Term Preterm Abortions TAB SAB Ect Mult Living   3 1 1  2 1 1   1       Review of Systems  All other systems reviewed and are negative.    Allergies  Keflex; Banana; Eggs or egg-derived products; Latex; and Sulfa antibiotics  Home Medications   Current Outpatient Rx  Name  Route  Sig  Dispense  Refill  . Ondansetron HCl (ZOFRAN PO)   Oral   Take by mouth.         . Promethazine HCl (PHENERGAN PO)   Oral   Take by mouth.         Marland Kitchen albuterol (PROVENTIL HFA;VENTOLIN HFA) 108 (90 BASE) MCG/ACT inhaler   Inhalation   Inhale 2 puffs into the lungs every 6 (six) hours as  needed. asthma         . aspirin 325 MG tablet   Oral   Take 325 mg by mouth daily.         Marland Kitchen ibuprofen (ADVIL,MOTRIN) 400 MG tablet   Oral   Take 400 mg by mouth every 6 (six) hours as needed for pain.         . montelukast (SINGULAIR) 10 MG tablet   Oral   Take 10 mg by mouth at bedtime.         . Multiple Vitamin (MULTIVITAMIN WITH MINERALS) TABS   Oral   Take 1 tablet by mouth daily.         Marland Kitchen omeprazole (PRILOSEC) 20 MG capsule   Oral   Take 1 capsule (20 mg total) by mouth daily.   30 capsule   3   . ondansetron (ZOFRAN-ODT) 8 MG disintegrating tablet   Oral   Take 1 tablet (8 mg total) by mouth every 8 (eight) hours as needed for nausea.   30 tablet   0   . Vitamin D, Ergocalciferol, (DRISDOL) 50000 UNITS CAPS   Oral   Take 50,000 Units by mouth every 7 (seven) days. On Tuesdays  BP 117/65  Pulse 85  Temp(Src) 98.4 F (36.9 C) (Oral)  Resp 16  Ht 5' (1.524 m)  Wt 132 lb (59.875 kg)  BMI 25.78 kg/m2  SpO2 98%  LMP 03/15/2013  Physical Exam General: Well-developed, well-nourished female in no acute distress; appearance consistent with age of record HENT: normocephalic, atraumatic Eyes: pupils equal round and reactive to light; extraocular muscles intact Neck: supple Heart: regular rate and rhythm Lungs: clear to auscultation bilaterally Abdomen: soft; nondistended; suprapubic tenderness; no masses or hepatosplenomegaly; bowel sounds present GU: Normal external genitalia; mucoid cervical discharge; no cervical motion tenderness; no adnexal tenderness right greater than left Extremities: No deformity; full range of motion; pulses normal; no edema Neurologic: Awake, alert and oriented; motor function intact in all extremities and symmetric; no facial droop Skin: Warm and dry Psychiatric: Normal mood and affect    ED Course  Procedures (including critical care time)     MDM   Nursing notes and vitals signs, including pulse  oximetry, reviewed.  Summary of this visit's results, reviewed by myself:  Labs:  Results for orders placed during the hospital encounter of 03/31/13 (from the past 24 hour(s))  URINALYSIS, ROUTINE W REFLEX MICROSCOPIC     Status: Abnormal   Collection Time    03/31/13 10:50 PM      Result Value Range   Color, Urine YELLOW  YELLOW   APPearance CLEAR  CLEAR   Specific Gravity, Urine 1.011  1.005 - 1.030   pH 6.5  5.0 - 8.0   Glucose, UA NEGATIVE  NEGATIVE mg/dL   Hgb urine dipstick NEGATIVE  NEGATIVE   Bilirubin Urine NEGATIVE  NEGATIVE   Ketones, ur NEGATIVE  NEGATIVE mg/dL   Protein, ur NEGATIVE  NEGATIVE mg/dL   Urobilinogen, UA 0.2  0.0 - 1.0 mg/dL   Nitrite NEGATIVE  NEGATIVE   Leukocytes, UA TRACE (*) NEGATIVE  PREGNANCY, URINE     Status: None   Collection Time    03/31/13 10:50 PM      Result Value Range   Preg Test, Ur NEGATIVE  NEGATIVE  URINE MICROSCOPIC-ADD ON     Status: Abnormal   Collection Time    03/31/13 10:50 PM      Result Value Range   Squamous Epithelial / LPF FEW (*) RARE   WBC, UA 3-6  <3 WBC/hpf   RBC / HPF 0-2  <3 RBC/hpf   Bacteria, UA FEW (*) RARE  CBC WITH DIFFERENTIAL     Status: Abnormal   Collection Time    03/31/13 11:50 PM      Result Value Range   WBC 9.3  4.0 - 10.5 K/uL   RBC 3.67 (*) 3.87 - 5.11 MIL/uL   Hemoglobin 9.8 (*) 12.0 - 15.0 g/dL   HCT 16.1 (*) 09.6 - 04.5 %   MCV 82.6  78.0 - 100.0 fL   MCH 26.7  26.0 - 34.0 pg   MCHC 32.3  30.0 - 36.0 g/dL   RDW 40.9  81.1 - 91.4 %   Platelets 353  150 - 400 K/uL   Neutrophils Relative % 51  43 - 77 %   Neutro Abs 4.7  1.7 - 7.7 K/uL   Lymphocytes Relative 34  12 - 46 %   Lymphs Abs 3.2  0.7 - 4.0 K/uL   Monocytes Relative 12  3 - 12 %   Monocytes Absolute 1.1 (*) 0.1 - 1.0 K/uL   Eosinophils Relative 3  0 - 5 %  Eosinophils Absolute 0.3  0.0 - 0.7 K/uL   Basophils Relative 0  0 - 1 %   Basophils Absolute 0.0  0.0 - 0.1 K/uL  COMPREHENSIVE METABOLIC PANEL     Status: Abnormal    Collection Time    03/31/13 11:50 PM      Result Value Range   Sodium 140  135 - 145 mEq/L   Potassium 3.5  3.5 - 5.1 mEq/L   Chloride 105  96 - 112 mEq/L   CO2 24  19 - 32 mEq/L   Glucose, Bld 99  70 - 99 mg/dL   BUN 10  6 - 23 mg/dL   Creatinine, Ser 4.09  0.50 - 1.10 mg/dL   Calcium 9.4  8.4 - 81.1 mg/dL   Total Protein 6.8  6.0 - 8.3 g/dL   Albumin 3.4 (*) 3.5 - 5.2 g/dL   AST 23  0 - 37 U/L   ALT 10  0 - 35 U/L   Alkaline Phosphatase 67  39 - 117 U/L   Total Bilirubin 0.2 (*) 0.3 - 1.2 mg/dL   GFR calc non Af Amer >90  >90 mL/min   GFR calc Af Amer >90  >90 mL/min  WET PREP, GENITAL     Status: Abnormal   Collection Time    04/01/13 12:45 AM      Result Value Range   Yeast Wet Prep HPF POC NONE SEEN  NONE SEEN   Trich, Wet Prep NONE SEEN  NONE SEEN   Clue Cells Wet Prep HPF POC NONE SEEN  NONE SEEN   WBC, Wet Prep HPF POC MODERATE (*) NONE SEEN    1:02 AM We will treat for possible early pelvic infection but have patient return later today for pelvic ultrasound to evaluate for ovarian cyst, tubo-ovarian abscess or other pathology.       Hanley Seamen, MD 04/01/13 [phone]

## 2013-04-01 NOTE — ED Notes (Signed)
Johnnette Gourd PA attempted to call pt x 3 today to given [place] results. No answer on pt cell phone and voice mail is full.

## 2013-04-01 NOTE — ED Notes (Signed)
Instructions for [place] prep for tomorrow and appt time given to pt in print form by Tera with radiology.  Pt opted for a 4pm appt time.  She voiced understanding of instructions.

## 2013-04-02 LAB — URINE CULTURE: Colony Count: NO GROWTH

## 2013-08-30 ENCOUNTER — Ambulatory Visit (INDEPENDENT_AMBULATORY_CARE_PROVIDER_SITE_OTHER): Payer: BC Managed Care – PPO | Admitting: Internal Medicine

## 2013-08-30 VITALS — BP 110/70 | HR 73 | Temp 98.5°F | Resp 16 | Ht 60.5 in | Wt 130.0 lb

## 2013-08-30 DIAGNOSIS — R109 Unspecified abdominal pain: Secondary | ICD-10-CM

## 2013-08-30 DIAGNOSIS — L659 Nonscarring hair loss, unspecified: Secondary | ICD-10-CM

## 2013-08-30 DIAGNOSIS — J45909 Unspecified asthma, uncomplicated: Secondary | ICD-10-CM

## 2013-08-30 DIAGNOSIS — M549 Dorsalgia, unspecified: Secondary | ICD-10-CM

## 2013-08-30 DIAGNOSIS — G4700 Insomnia, unspecified: Secondary | ICD-10-CM

## 2013-08-30 DIAGNOSIS — Z0000 Encounter for general adult medical examination without abnormal findings: Secondary | ICD-10-CM

## 2013-08-30 DIAGNOSIS — R634 Abnormal weight loss: Secondary | ICD-10-CM

## 2013-08-30 DIAGNOSIS — R42 Dizziness and giddiness: Secondary | ICD-10-CM

## 2013-08-30 DIAGNOSIS — R5381 Other malaise: Secondary | ICD-10-CM

## 2013-08-30 LAB — COMPREHENSIVE METABOLIC PANEL
ALT: 8 U/L (ref 0–35)
AST: 20 U/L (ref 0–37)
Alkaline Phosphatase: 63 U/L (ref 39–117)
Potassium: 4.1 mEq/L (ref 3.5–5.3)
Sodium: 137 mEq/L (ref 135–145)
Total Bilirubin: 0.5 mg/dL (ref 0.3–1.2)
Total Protein: 8.1 g/dL (ref 6.0–8.3)

## 2013-08-30 LAB — POCT URINALYSIS DIPSTICK
Glucose, UA: NEGATIVE
Ketones, UA: NEGATIVE
Leukocytes, UA: NEGATIVE
Nitrite, UA: NEGATIVE
Spec Grav, UA: 1.02
Urobilinogen, UA: 0.2

## 2013-08-30 LAB — POCT URINE PREGNANCY: Preg Test, Ur: NEGATIVE

## 2013-08-30 LAB — POCT UA - MICROSCOPIC ONLY
Bacteria, U Microscopic: NEGATIVE
RBC, urine, microscopic: NEGATIVE
WBC, Ur, HPF, POC: NEGATIVE

## 2013-08-30 LAB — POCT CBC
Granulocyte percent: 47.9 %G (ref 37–80)
Hemoglobin: 11.3 g/dL — AB (ref 12.2–16.2)
MCV: 86.4 fL (ref 80–97)
MID (cbc): 1.5 — AB (ref 0–0.9)
MPV: 9.8 fL (ref 0–99.8)
POC MID %: 11.4 %M (ref 0–12)
Platelet Count, POC: 128 10*3/uL — AB (ref 142–424)
RBC: 4.31 M/uL (ref 4.04–5.48)

## 2013-08-30 MED ORDER — CLONAZEPAM 0.5 MG PO TABS: 0.5000 mg | ORAL_TABLET | Freq: Every evening | ORAL | Status: DC | PRN

## 2013-08-30 MED ORDER — MONTELUKAST SODIUM 10 MG PO TABS: 10.0000 mg | ORAL_TABLET | Freq: Every day | ORAL | Status: AC

## 2013-08-30 MED ORDER — ALBUTEROL SULFATE HFA 108 (90 BASE) MCG/ACT IN AERS: 2.0000 | INHALATION_SPRAY | Freq: Four times a day (QID) | RESPIRATORY_TRACT | Status: DC | PRN

## 2013-08-30 NOTE — Progress Notes (Signed)
This chart was scribed for Melinda Pearson, MD by Caryn Bee, Medical Scribe. This patient was seen in Room/bed 10 and the patient's care was started at 3:24 PM.  Subjective:    Patient ID: Melinda Maldonado, female    DOB: 12/20/1990, 22 y.o.   MRN: [id number]  HPI HPI Comments: Melinda Maldonado is a 22 y.o. female with h/o asthma who presents to [facility] requesting physical exam. Pt complains of abdominal pain with associated nausea and diarrhea. Her abdominal pain is worse when she sits up. She also reports back pain that does not radiate. Pt reports fatigue, onset for about 1 month. She has also lost about 8 pounds within the last month. Pt reports occasional nosebleeds, tinnitus, and trouble swallowing.  Pt has been experiencing dysuria. She has h/o UTI and states that her current symptoms are not similar to previous symptoms with UTI. She experiences painful intercourse/? Lack of lubrication. Pt has been with her partner for 2 years. Her LNMP was August 19, 2013. Pt's last normal pap smear was November 2012. Pt denies GERD, leg swelling, vaginal discharge, vaginal itching.  Pt has also been experiencing hair loss in the past 3 months.  Pt has no family h/o breast cancer. She has not felt any nodules in her breasts.   She has a 59 y.o daughter.  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   Review of Systems  Constitutional: Positive for fatigue.  HENT: Positive for nosebleeds, tinnitus and trouble swallowing.   Cardiovascular: Negative for leg swelling.  Gastrointestinal: Positive for nausea, abdominal pain and diarrhea.  Genitourinary: Positive for dyspareunia. Negative for dysuria and vaginal discharge.  Musculoskeletal: Positive for back pain.   the remainder of the 13 point review of systems is negative  Past Medical History  Diagnosis Date  . Asthma   . Allergy   . Anemia   . Anxiety    History   Social History  . Marital  Status: Single    Spouse Name: N/A    Number of Children: N/A  . Years of Education: N/A   Occupational History  . Not on file.   Social History Main Topics  . Smoking status: Never Smoker   . Smokeless tobacco: Not on file  . Alcohol Use: No  . Drug Use: No  . Sexual Activity: Not on file   Other Topics Concern  . Not on file   Social History Narrative  . No narrative on file   No past surgical history on file. Family History  Problem Relation Age of Onset  . Asthma Sister   . Asthma Daughter   . Cancer Paternal Grandmother    Allergies  Allergen Reactions  . Keflex [Cephalexin] Anaphylaxis  . Banana Hives  . Eggs Or Egg-Derived Products Other (See Comments)    Body gets hot  . Latex Itching    Rash, blistering and burning  . Sulfa Antibiotics         Objective:   Physical Exam  Nursing note and vitals reviewed. Constitutional: She is oriented to person, place, and time. She appears well-developed and well-nourished. No distress.  HENT:  Head: Normocephalic and atraumatic.  Right Ear: External ear normal.  Left Ear: External ear normal.  Nose: Nose normal.  Mouth/Throat: Oropharynx is clear and moist.  Eyes: Conjunctivae and EOM are normal. Pupils are equal, round, and reactive to light.  Neck: Normal range of motion. Neck  supple. No thyromegaly present.  Cardiovascular: Normal rate, regular rhythm, normal heart sounds and intact distal pulses.   No murmur heard. Pulmonary/Chest: Effort normal and breath sounds normal. No respiratory distress. She has no wheezes. She has no rales. She exhibits no tenderness.  Abdominal: Soft. Bowel sounds are normal. There is tenderness.  Vague generalized tenderness to palpation without organomegaly or masses.  Genitourinary: Vagina normal. No breast swelling, tenderness, discharge or bleeding. There is no rash on the right labia. There is no rash on the left labia. No vaginal discharge found.  Uterus mid position  nontender and not swollen. No adnexal masses.  Musculoskeletal: Normal range of motion.  Tender over the lumbar spinous processes, but negative straight leg raise bilaterally.   Lymphadenopathy:    She has no cervical adenopathy.       Right: No inguinal adenopathy present.       Left: No inguinal adenopathy present.  Neurological: She is alert and oriented to person, place, and time. She has normal reflexes. No cranial nerve deficit.  Skin:  Mild facial acne.  Psychiatric: She has a normal mood and affect. Her behavior is normal. Judgment and thought content normal.  BP 110/70  Pulse 73  Temp(Src) 98.5 F (36.9 C) (Oral)  Resp 16  Ht 5' 0.5" (1.537 m)  Wt 130 lb (58.968 kg)  BMI 24.96 kg/m2  SpO2 100%  LMP 08/19/2013      Assessment & Plan:  Annual physical exam  Abdominal pain, unspecified site  Lightheaded Other malaise and fatigue Loss of weight -  Back pain Hair loss Insomnia - Plan: clonazePAM (KLONOPIN) 0.5 MG tablet Intrinsic asthma--refill meds  Meds ordered this encounter  Medications  . clonazePAM (KLONOPIN) 0.5 MG tablet    Sig: Take 1 tablet (0.5 mg total) by mouth at bedtime as needed.    Dispense:  30 tablet    Refill:  1  . montelukast (SINGULAIR) 10 MG tablet    Sig: Take 1 tablet (10 mg total) by mouth at bedtime.    Dispense:  90 tablet    Refill:  3  . albuterol (PROVENTIL HFA;VENTOLIN HFA) 108 (90 BASE) MCG/ACT inhaler    Sig: Inhale 2 puffs into the lungs every 6 (six) hours as needed. asthma    Dispense:  1 Inhaler    Refill:  11      I have completed the patient encounter in its entirety as documented by the scribe, with editing by me where necessary. Breonia Kirstein P. Merla Riches, M.D.

## 2013-08-31 DIAGNOSIS — J45909 Unspecified asthma, uncomplicated: Secondary | ICD-10-CM | POA: Insufficient documentation

## 2013-08-31 LAB — PAP IG, CT-NG, RFX HPV ASCU: Chlamydia Probe Amp: NEGATIVE

## 2013-09-04 ENCOUNTER — Telehealth: Payer: Self-pay

## 2013-09-04 NOTE — Telephone Encounter (Signed)
Pt is needing to talk with someone about her medication and her symptoms  Best number is [phone]

## 2013-09-05 NOTE — Telephone Encounter (Signed)
Pt notified. Will try 2 tonight and let us know tomorrow if it helps

## 2013-09-05 NOTE — Telephone Encounter (Signed)
Will forward to Dr. Merla Riches to advise on insomnia and possible medication change

## 2013-09-05 NOTE — Telephone Encounter (Signed)
Take 2 at hs and let us know results

## 2013-09-05 NOTE — Telephone Encounter (Signed)
Also gave pt her normal lab results.

## 2013-09-05 NOTE — Telephone Encounter (Signed)
Was prescribed Clonazepam. Says it is still not helping her sleep. She states she takes it 32min-1 hour before going to bed. She took it at 9pm, slept for an hour, and was back awake. Do we need to increase dose or switch her to another med?

## 2013-10-04 ENCOUNTER — Ambulatory Visit: Payer: BC Managed Care – PPO

## 2014-06-12 ENCOUNTER — Emergency Department (HOSPITAL_COMMUNITY): Payer: BC Managed Care – PPO

## 2014-06-12 ENCOUNTER — Encounter (HOSPITAL_COMMUNITY): Payer: Self-pay | Admitting: Emergency Medicine

## 2014-06-12 ENCOUNTER — Emergency Department (HOSPITAL_COMMUNITY)
Admission: EM | Admit: 2014-06-12 | Discharge: 2014-06-12 | Disposition: A | Discharge status: Home / Self Care | Payer: BC Managed Care – PPO | Attending: Emergency Medicine | Admitting: Emergency Medicine

## 2014-06-12 DIAGNOSIS — J45901 Unspecified asthma with (acute) exacerbation: Secondary | ICD-10-CM | POA: Insufficient documentation

## 2014-06-12 DIAGNOSIS — J45909 Unspecified asthma, uncomplicated: Secondary | ICD-10-CM | POA: Insufficient documentation

## 2014-06-12 DIAGNOSIS — Z862 Personal history of diseases of the blood and blood-forming organs and certain disorders involving the immune mechanism: Secondary | ICD-10-CM | POA: Insufficient documentation

## 2014-06-12 DIAGNOSIS — F411 Generalized anxiety disorder: Secondary | ICD-10-CM | POA: Insufficient documentation

## 2014-06-12 DIAGNOSIS — J4520 Mild intermittent asthma, uncomplicated: Secondary | ICD-10-CM

## 2014-06-12 DIAGNOSIS — Z791 Long term (current) use of non-steroidal anti-inflammatories (NSAID): Secondary | ICD-10-CM | POA: Insufficient documentation

## 2014-06-12 DIAGNOSIS — Z79899 Other long term (current) drug therapy: Secondary | ICD-10-CM | POA: Insufficient documentation

## 2014-06-12 DIAGNOSIS — Z91040 Latex allergy status: Secondary | ICD-10-CM | POA: Insufficient documentation

## 2014-06-12 MED ORDER — PREDNISONE 20 MG PO TABS
60.0000 mg | ORAL_TABLET | Freq: Once | ORAL | Status: AC
  Administered 2014-06-12: 60 mg via ORAL
  Filled 2014-06-12: qty 3

## 2014-06-12 MED ORDER — IPRATROPIUM BROMIDE 0.02 % IN SOLN
0.5000 mg | Freq: Once | RESPIRATORY_TRACT | Status: AC
  Administered 2014-06-12: 0.5 mg via RESPIRATORY_TRACT
  Filled 2014-06-12: qty 2.5

## 2014-06-12 MED ORDER — ALBUTEROL SULFATE (2.5 MG/3ML) 0.083% IN NEBU
5.0000 mg | INHALATION_SOLUTION | Freq: Once | RESPIRATORY_TRACT | Status: AC
  Administered 2014-06-12: 5 mg via RESPIRATORY_TRACT
  Filled 2014-06-12: qty 6

## 2014-06-12 MED ORDER — PREDNISONE 10 MG PO TABS: 20.0000 mg | ORAL_TABLET | Freq: Every day | ORAL | Status: AC

## 2014-06-12 NOTE — ED Notes (Signed)
Respiratory therapist at bedside.

## 2014-06-12 NOTE — Discharge Instructions (Signed)

## 2014-06-12 NOTE — ED Notes (Signed)
Per pt, asthma attack starting this morning.  Inhaler no nebulizer.  Inhaler not working.  Positive for cough also.  ? Fever.

## 2014-06-12 NOTE — ED Provider Notes (Signed)
CSN: [id number]     Arrival date & time 06/12/14  1130 History   First MD Initiated Contact with Patient 06/12/14 1146     Chief Complaint  Patient presents with  . Asthma     (Consider location/radiation/quality/duration/timing/severity/associated sxs/prior Treatment) HPI Comments: Patient was driving asthma that began. Did not have her inhaler present. Has had some nonproductive cough recently. No vomiting or diarrhea noted.  Patient is a 23 y.o. female presenting with asthma. The history is provided by the patient.  Asthma This is a recurrent problem. The current episode started less than 1 hour ago. The problem occurs every several days. The problem has been gradually improving. Associated symptoms include shortness of breath. Nothing aggravates the symptoms. Nothing relieves the symptoms. She has tried nothing for the symptoms.    Past Medical History  Diagnosis Date  . Asthma   . Allergy   . Anemia   . Anxiety    History reviewed. No pertinent past surgical history. Family History  Problem Relation Age of Onset  . Asthma Sister   . Asthma Daughter   . Cancer Paternal Grandmother    History  Substance Use Topics  . Smoking status: Never Smoker   . Smokeless tobacco: Not on file  . Alcohol Use: No   OB History   Grav Para Term Preterm Abortions TAB SAB Ect Mult Living   3 1 1  2 1 1   1      Review of Systems  Respiratory: Positive for shortness of breath.   All other systems reviewed and are negative.     Allergies  Keflex; Banana; Eggs or egg-derived products; Latex; and Sulfa antibiotics  Home Medications   Prior to Admission medications   Medication Sig Start Date End Date Taking? Authorizing Provider  montelukast (SINGULAIR) 10 MG tablet Take 1 tablet (10 mg total) by mouth at bedtime. 08/30/13  Yes [name], MD  Vitamin D, Ergocalciferol, (DRISDOL) 50000 UNITS CAPS Take 100,000 Units by mouth every 7 (seven) days. On Tuesdays   Yes  Historical Provider, MD  albuterol (PROVENTIL HFA;VENTOLIN HFA) 108 (90 BASE) MCG/ACT inhaler Inhale 2 puffs into the lungs every 6 (six) hours as needed. asthma 08/30/13   [name], MD  clonazePAM (KLONOPIN) 0.5 MG tablet Take 1 tablet (0.5 mg total) by mouth at bedtime as needed. 08/30/13   [name], MD  ibuprofen (ADVIL,MOTRIN) 400 MG tablet Take 400 mg by mouth every 6 (six) hours as needed for pain.    Historical Provider, MD   BP 149/72  Pulse 79  Temp(Src) 98.2 F (36.8 C) (Oral)  Resp 20  SpO2 100%  LMP 05/22/2014 Physical Exam  Nursing note and vitals reviewed. Constitutional: She is oriented to person, place, and time. She appears well-developed and well-nourished.  Non-toxic appearance. No distress.  HENT:  Head: Normocephalic and atraumatic.  Eyes: Conjunctivae, EOM and lids are normal. Pupils are equal, round, and reactive to light.  Neck: Normal range of motion. Neck supple. No tracheal deviation present. No mass present.  Cardiovascular: Normal rate, regular rhythm and normal heart sounds.  Exam reveals no gallop.   No murmur heard. Pulmonary/Chest: Effort normal. No stridor. No respiratory distress. She has decreased breath sounds. She has no wheezes. She has no rhonchi. She has no rales.  Abdominal: Soft. Normal appearance and bowel sounds are normal. She exhibits no distension. There is no tenderness. There is no rebound and no CVA tenderness.  Musculoskeletal: Normal range of  motion. She exhibits no edema and no tenderness.  Neurological: She is alert and oriented to person, place, and time. She has normal strength. No cranial nerve deficit or sensory deficit. GCS eye subscore is 4. GCS verbal subscore is 5. GCS motor subscore is 6.  Skin: Skin is warm and dry. No abrasion and no rash noted.  Psychiatric: She has a normal mood and affect. Her speech is normal and behavior is normal.    ED Course  Procedures (including critical care time) Labs  Review Labs Reviewed - No data to display  Imaging Review No results found.   EKG Interpretation None      MDM   Final diagnoses:  None    Patient given breathing treatment and prednisone here feels better. No evidence of infection a chest x-ray. Stable for discharge    Toy BakerAnthony T Chasyn Cinque, MD 06/12/14 1334

## 2014-06-12 NOTE — ED Notes (Signed)
Pt reports asthma attack that started about 20 minutes ago, sts she couldn't find her inhaler today. Pt reports asthma attacks at least 3 times a week. Pt is whispering, reports "can't talk because I can't catch my breath". O2 sats 100% on room air, lung sounds clear, somewhat diminished.

## 2014-09-04 ENCOUNTER — Encounter (HOSPITAL_COMMUNITY): Payer: Self-pay | Admitting: Emergency Medicine

## 2014-11-06 IMAGING — CR DG CHEST 2V
2 series · 2 of 2 positions shown · non-contrast
Comparison: None.

CLINICAL DATA: Shortness of breath and asthma.

CHEST - 2 VIEW

[w chest pa]
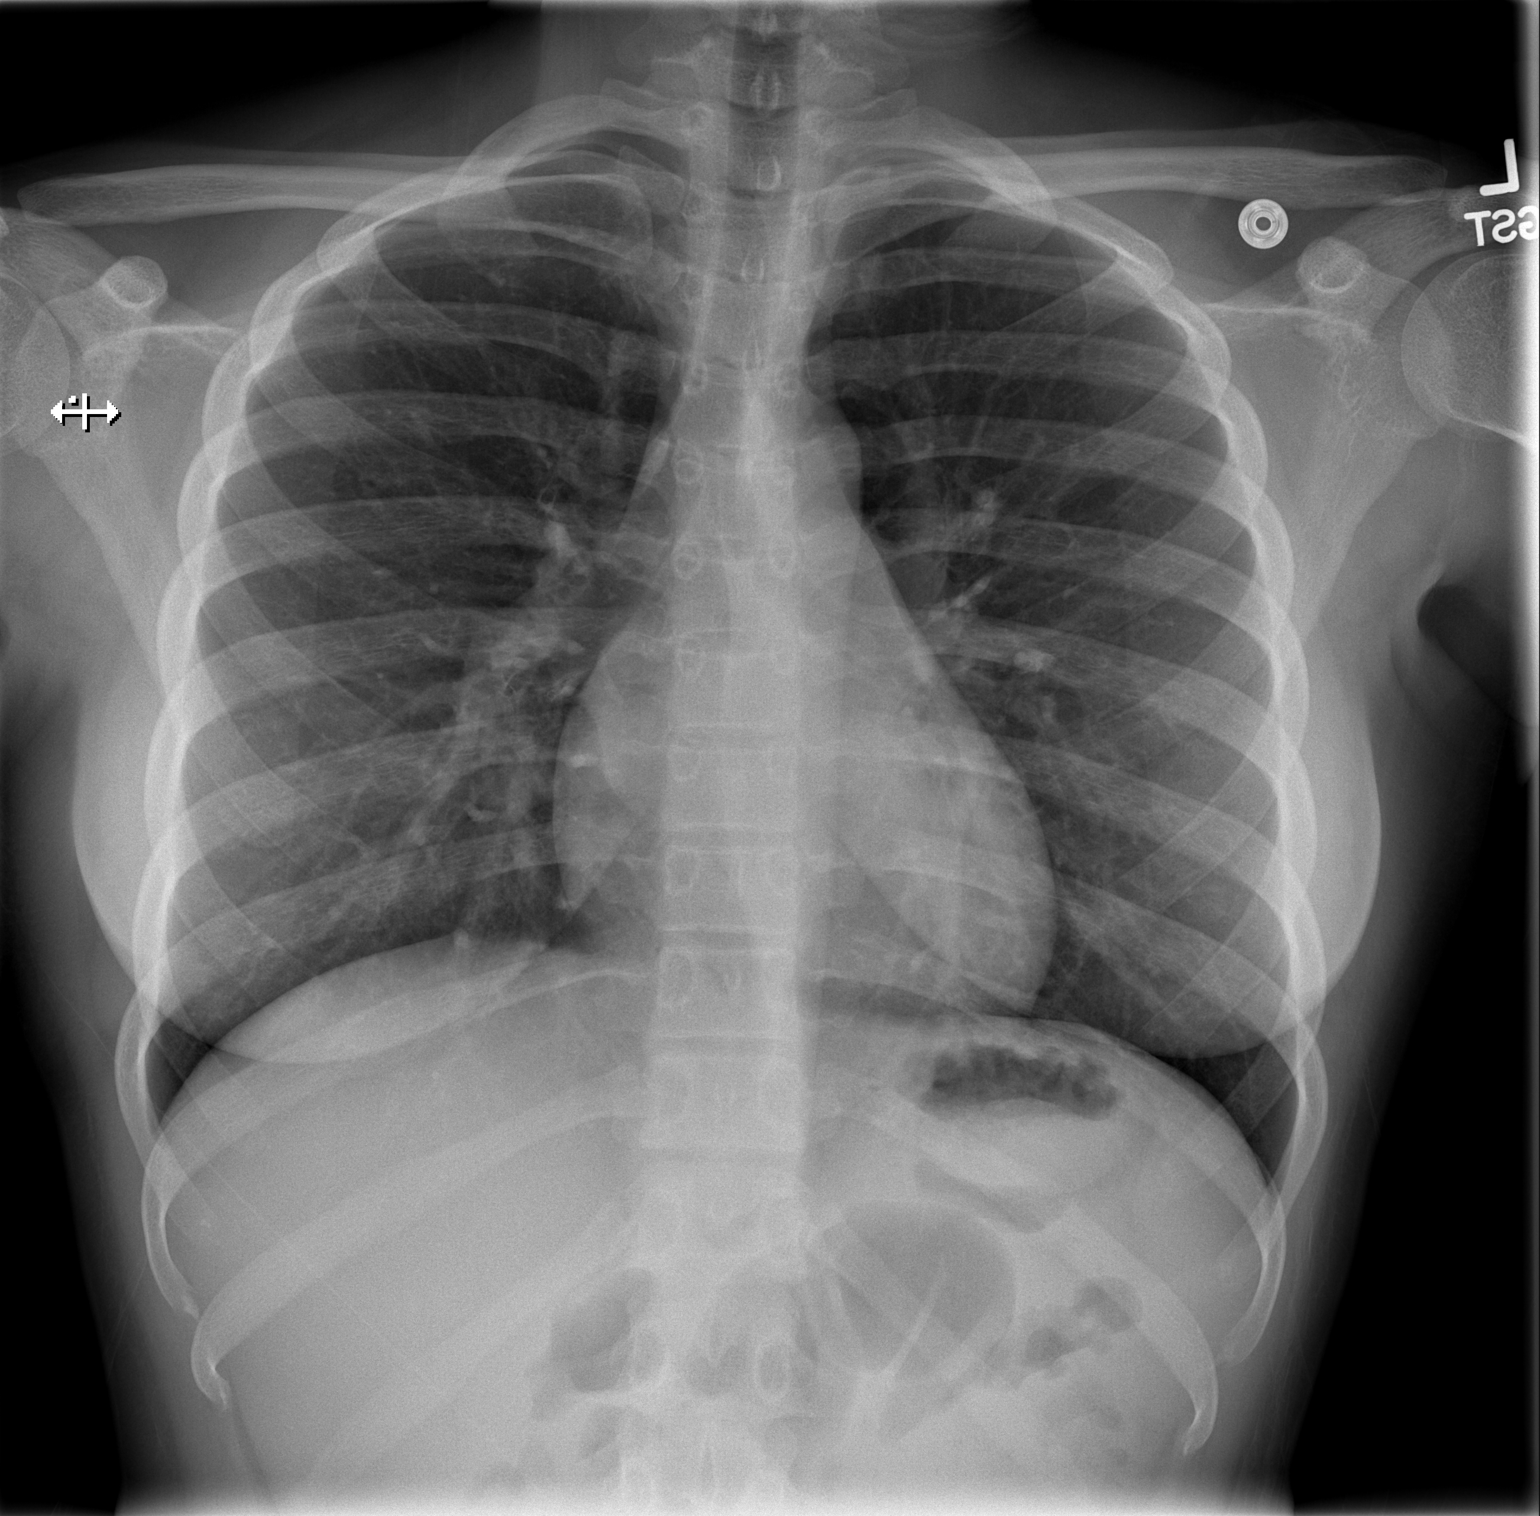

[w chest lat]
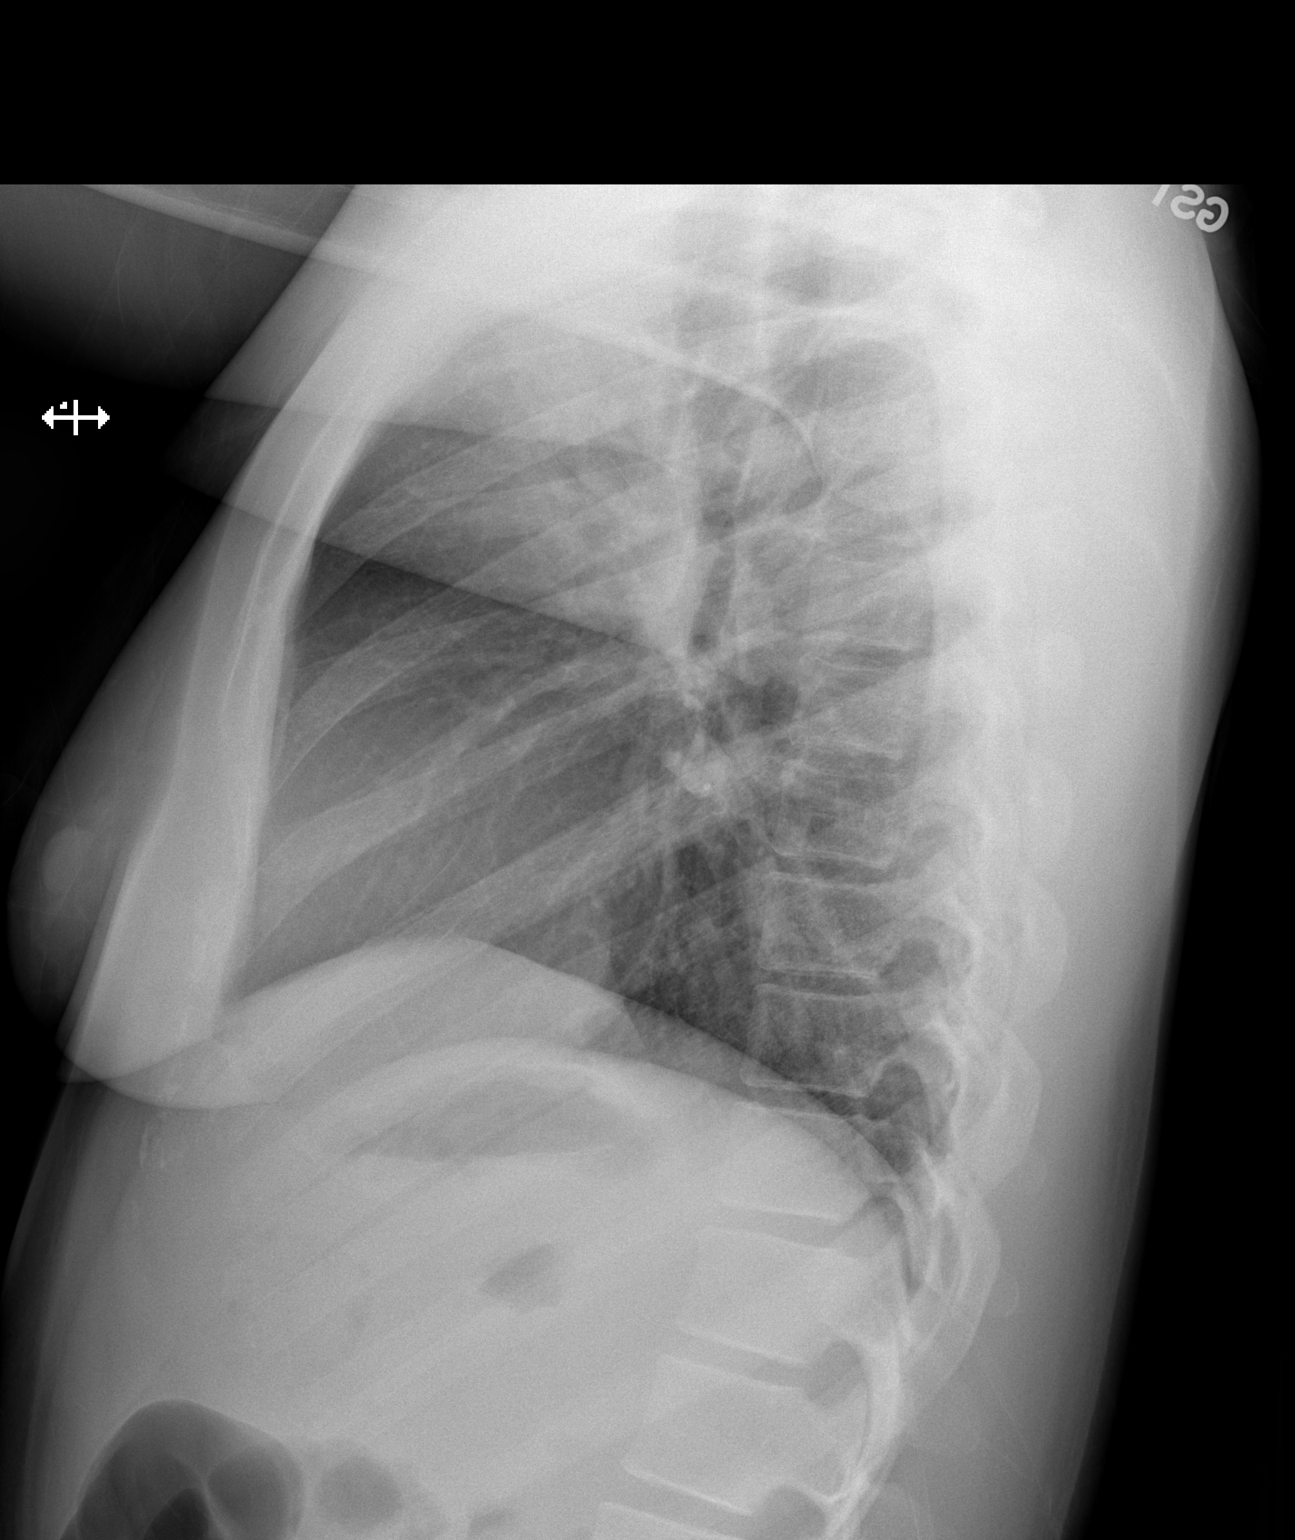

[2 of 2 positions shown; findings below may reference images not displayed]

FINDINGS: Normal inspiration. The heart size and pulmonary
vascularity are normal. The lungs appear clear and expanded without
focal air space disease or consolidation. No blunting of the
costophrenic angles.  No pneumothorax.  Mediastinal contours are
intact.
IMPRESSION: No evidence of active pulmonary disease.

## 2015-07-16 ENCOUNTER — Encounter: Payer: Self-pay | Admitting: Emergency Medicine

## 2015-07-16 ENCOUNTER — Ambulatory Visit
Admission: EM | Admit: 2015-07-16 | Discharge: 2015-07-16 | Disposition: A | Discharge status: Home / Self Care | Payer: BLUE CROSS/BLUE SHIELD | Attending: Family Medicine | Admitting: Family Medicine

## 2015-07-16 ENCOUNTER — Other Ambulatory Visit: Payer: Self-pay

## 2015-07-16 DIAGNOSIS — J45901 Unspecified asthma with (acute) exacerbation: Secondary | ICD-10-CM | POA: Insufficient documentation

## 2015-07-16 DIAGNOSIS — J45909 Unspecified asthma, uncomplicated: Secondary | ICD-10-CM | POA: Diagnosis present

## 2015-07-16 MED ORDER — IPRATROPIUM-ALBUTEROL 0.5-2.5 (3) MG/3ML IN SOLN
3.0000 mL | Freq: Four times a day (QID) | RESPIRATORY_TRACT | Status: DC
  Administered 2015-07-16 (×2): 3 mL via RESPIRATORY_TRACT

## 2015-07-16 MED ORDER — ALBUTEROL SULFATE HFA 108 (90 BASE) MCG/ACT IN AERS: 1.0000 | INHALATION_SPRAY | RESPIRATORY_TRACT | Status: AC | PRN

## 2015-07-17 NOTE — ED Provider Notes (Signed)
CSN: [id number]     Arrival date & time 07/16/15  1546 History   First MD Initiated Contact with Patient 07/16/15 1611     Chief Complaint  Patient presents with  . Asthma   (Consider location/radiation/quality/duration/timing/severity/associated sxs/prior Treatment) HPI Comments: 24 yo female with a h/o asthma presents with an acute onset of shortness of breath, chest discomfort and wheezing this afternoon. Patient states she had run out of her albuterol rescue inhaler. Was seen last week by PCP with similar and had been started on prednisone; also taking singulair. Denies any fevers, chills, cough.   Patient is a 24 y.o. female presenting with asthma. The history is provided by the patient.  Asthma    Past Medical History  Diagnosis Date  . Asthma   . Allergy   . Anemia   . Anxiety    History reviewed. No pertinent past surgical history. Family History  Problem Relation Age of Onset  . Asthma Sister   . Asthma Daughter   . Cancer Paternal Grandmother    Social History  Substance Use Topics  . Smoking status: Never Smoker   . Smokeless tobacco: None  . Alcohol Use: No   OB History    Gravida Para Term Preterm AB TAB SAB Ectopic Multiple Living   Review of Systems  Allergies  Keflex; Banana; Eggs or egg-derived products; Latex; and Sulfa antibiotics  Home Medications   Prior to Admission medications   Medication Sig Start Date End Date Taking? Authorizing Provider  albuterol (PROVENTIL HFA;VENTOLIN HFA) 108 (90 BASE) MCG/ACT inhaler Inhale 1-2 puffs into the lungs every 4 (four) hours as needed for wheezing or shortness of breath. 07/16/15   Payton Mccallum, MD  Minocycline HCl (SOLODYN) 65 MG TB24 Take 1 tablet by mouth daily.    Historical Provider, MD  montelukast (SINGULAIR) 10 MG tablet Take 1 tablet (10 mg total) by mouth at bedtime. 08/30/13   [name], MD  predniSONE (DELTASONE) 10 MG tablet Take 2 tablets (20 mg total) by mouth  daily. Take 4 tablets by mouth daily x5 days 06/12/14   Lorre Nick, MD  Vitamin D, Ergocalciferol, (DRISDOL) 50000 UNITS CAPS Take 100,000 Units by mouth every 7 (seven) days. On Tuesdays    Historical Provider, MD   Meds Ordered and Administered this Visit  Medications - No data to display  BP 133/82 mmHg  Pulse 90  Temp(Src) 97.9 F (36.6 C) (Oral)  Resp 24  SpO2 100% No data found.   Physical Exam  Constitutional: She appears well-developed and well-nourished. No distress.  HENT:  Head: Normocephalic and atraumatic.  Right Ear: Tympanic membrane, external ear and ear canal normal.  Left Ear: Tympanic membrane, external ear and ear canal normal.  Nose: No mucosal edema, rhinorrhea, nose lacerations, sinus tenderness, nasal deformity, septal deviation or nasal septal hematoma. No epistaxis.  No foreign bodies. Right sinus exhibits no maxillary sinus tenderness and no frontal sinus tenderness. Left sinus exhibits no maxillary sinus tenderness and no frontal sinus tenderness.  Mouth/Throat: Uvula is midline, oropharynx is clear and moist and mucous membranes are normal. No oropharyngeal exudate.  Eyes: Conjunctivae and EOM are normal. Pupils are equal, round, and reactive to light. Right eye exhibits no discharge. Left eye exhibits no discharge. No scleral icterus.  Neck: Normal range of motion. Neck supple. No thyromegaly present.  Cardiovascular: Normal rate, regular rhythm and normal heart sounds.  Pulmonary/Chest: Effort normal. No respiratory distress. She has wheezes (diffuse expiratory wheezes throughout bilaterally). She has no rales.  Lymphadenopathy:    She has no cervical adenopathy.  Skin: No rash noted. She is not diaphoretic.  Nursing note and vitals reviewed.   ED Course  Procedures (including critical care time)  Labs Review Labs Reviewed - No data to display  Imaging Review No results found.   Visual Acuity Review  Right Eye Distance:   Left Eye  Distance:   Bilateral Distance:    Right Eye Near:   Left Eye Near:    Bilateral Near:      EKG: normal EKG, normal sinus rhythm, there are no previous tracings available for comparison; reviewed by me and agree with printout.  MDM   1. Asthma exacerbation    Discharge Medication List as of 07/16/2015  5:08 PM    Plan: 1. Test results and diagnosis reviewed with patient 2. rx as per orders; risks, benefits, potential side effects reviewed with patient; patient given rx for albuterol inhaler 3. Patient given Duoneb treatment x2 in clinic with resolution of symptoms 4. Recommend continue other current asthma medications, singulair and prednisone (recently prescribed by PCP) 5. Recommend f/u with PCP 6. F/U here prn if symptoms worsen or don't improve    Payton Mccallum, MD 07/17/15 [phone]

## 2016-04-15 ENCOUNTER — Emergency Department (HOSPITAL_COMMUNITY)
Admission: EM | Admit: 2016-04-15 | Discharge: 2016-04-15 | Disposition: A | Discharge status: Home / Self Care | Payer: 59 | Attending: Dermatology | Admitting: Dermatology

## 2016-04-15 ENCOUNTER — Encounter (HOSPITAL_COMMUNITY): Payer: Self-pay | Admitting: Nurse Practitioner

## 2016-04-15 DIAGNOSIS — J45909 Unspecified asthma, uncomplicated: Secondary | ICD-10-CM | POA: Diagnosis not present

## 2016-04-15 DIAGNOSIS — Z79899 Other long term (current) drug therapy: Secondary | ICD-10-CM | POA: Insufficient documentation

## 2016-04-15 DIAGNOSIS — Z5321 Procedure and treatment not carried out due to patient leaving prior to being seen by health care provider: Secondary | ICD-10-CM | POA: Insufficient documentation

## 2016-04-15 DIAGNOSIS — R569 Unspecified convulsions: Secondary | ICD-10-CM | POA: Diagnosis present

## 2016-04-15 LAB — BASIC METABOLIC PANEL
Anion gap: 8 (ref 5–15)
BUN: 7 mg/dL (ref 6–20)
CO2: 21 mmol/L — AB (ref 22–32)
CREATININE: 0.73 mg/dL (ref 0.44–1.00)
Calcium: 9.3 mg/dL (ref 8.9–10.3)
Chloride: 109 mmol/L (ref 101–111)
GFR calc non Af Amer: >60 mL/min (ref >60)
Glucose, Bld: 91 mg/dL (ref 65–99)
Potassium: 4 mmol/L (ref 3.5–5.1)
Sodium: 138 mmol/L (ref 135–145)

## 2016-04-15 LAB — CBC
HEMATOCRIT: 33.9 % — AB (ref 36.0–46.0)
Hemoglobin: 10.7 g/dL — ABNORMAL LOW (ref 12.0–15.0)
MCH: 26.3 pg (ref 26.0–34.0)
MCHC: 31.6 g/dL (ref 30.0–36.0)
MCV: 83.3 fL (ref 78.0–100.0)
Platelets: 371 10*3/uL (ref 150–400)
RBC: 4.07 MIL/uL (ref 3.87–5.11)
RDW: 14.2 % (ref 11.5–15.5)
WBC: 7 10*3/uL (ref 4.0–10.5)

## 2016-04-15 NOTE — ED Notes (Addendum)
Pt reports she's been unable to talk since waking this morning and she thinks she had a seizure. Denies history of seizures. Pt is able to lip speak without audible sound. She will not ambulate. Denies pain, incontinence, injuries. She is alert and breathing easily.

## 2016-04-15 NOTE — ED Notes (Signed)
Patient left without being seen.

## 2016-04-15 NOTE — ED Notes (Addendum)
The pt walked out of triage room and states that she would like to go home. Pt encouraged to stay for medical screening

## 2016-04-27 ENCOUNTER — Telehealth (HOSPITAL_BASED_OUTPATIENT_CLINIC_OR_DEPARTMENT_OTHER): Payer: Self-pay

## 2017-05-29 ENCOUNTER — Emergency Department
Admission: EM | Admit: 2017-05-29 | Discharge: 2017-05-29 | Disposition: A | Discharge status: Home / Self Care | Payer: Medicaid Other | Attending: Emergency Medicine | Admitting: Emergency Medicine

## 2017-05-29 ENCOUNTER — Encounter: Payer: Self-pay | Admitting: Emergency Medicine

## 2017-05-29 DIAGNOSIS — N390 Urinary tract infection, site not specified: Secondary | ICD-10-CM

## 2017-05-29 DIAGNOSIS — Z79899 Other long term (current) drug therapy: Secondary | ICD-10-CM | POA: Insufficient documentation

## 2017-05-29 DIAGNOSIS — J45909 Unspecified asthma, uncomplicated: Secondary | ICD-10-CM | POA: Diagnosis not present

## 2017-05-29 DIAGNOSIS — R55 Syncope and collapse: Secondary | ICD-10-CM

## 2017-05-29 DIAGNOSIS — Z91040 Latex allergy status: Secondary | ICD-10-CM | POA: Insufficient documentation

## 2017-05-29 LAB — BASIC METABOLIC PANEL
Anion gap: 9 (ref 5–15)
BUN: 9 mg/dL (ref 6–20)
CALCIUM: 9.8 mg/dL (ref 8.9–10.3)
CO2: 24 mmol/L (ref 22–32)
Chloride: 105 mmol/L (ref 101–111)
Creatinine, Ser: 0.84 mg/dL (ref 0.44–1.00)
GFR calc Af Amer: >60 mL/min (ref >60)
Glucose, Bld: 79 mg/dL (ref 65–99)
Potassium: 3.8 mmol/L (ref 3.5–5.1)
Sodium: 138 mmol/L (ref 135–145)

## 2017-05-29 LAB — CBC WITH DIFFERENTIAL/PLATELET
BASOS ABS: 0.1 10*3/uL (ref 0–0.1)
BASOS PCT: 1 %
Eosinophils Absolute: 0.3 10*3/uL (ref 0–0.7)
Eosinophils Relative: 3 %
HEMATOCRIT: 35.8 % (ref 35.0–47.0)
HEMOGLOBIN: 11.7 g/dL — AB (ref 12.0–16.0)
Lymphocytes Relative: 27 %
Lymphs Abs: 2.3 10*3/uL (ref 1.0–3.6)
MCH: 26.5 pg (ref 26.0–34.0)
MCHC: 32.8 g/dL (ref 32.0–36.0)
MCV: 80.7 fL (ref 80.0–100.0)
Monocytes Absolute: 0.7 10*3/uL (ref 0.2–0.9)
Monocytes Relative: 9 %
NEUTROS PCT: 60 %
Neutro Abs: 5.3 10*3/uL (ref 1.4–6.5)
Platelets: 409 10*3/uL (ref 150–440)
RBC: 4.44 MIL/uL (ref 3.80–5.20)
RDW: 17 % — ABNORMAL HIGH (ref 11.5–14.5)
WBC: 8.7 10*3/uL (ref 3.6–11.0)

## 2017-05-29 LAB — URINALYSIS, COMPLETE (UACMP) WITH MICROSCOPIC
Bilirubin Urine: NEGATIVE
GLUCOSE, UA: NEGATIVE mg/dL
HGB URINE DIPSTICK: NEGATIVE
Ketones, ur: 5 mg/dL — AB
NITRITE: NEGATIVE
PROTEIN: NEGATIVE mg/dL
Specific Gravity, Urine: 1.021 (ref 1.005–1.030)
pH: 5 (ref 5.0–8.0)

## 2017-05-29 LAB — POCT PREGNANCY, URINE: PREG TEST UR: NEGATIVE

## 2017-05-29 MED ORDER — NITROFURANTOIN MACROCRYSTAL 100 MG PO CAPS: 100.0000 mg | ORAL_CAPSULE | Freq: Two times a day (BID) | ORAL | 0 refills | Status: AC

## 2017-05-29 NOTE — ED Triage Notes (Signed)
Pt to ED via [facility] from work. Co workers report pt said she felt faint, pt sat down in a chair and went unresponsive for approximately 10 minutes. When pt came to she was unable to speak. Pt still unable to speak. EMS reports hx/o similar episode in the past that was thought to be due to anemia and hypoxia.

## 2017-05-29 NOTE — ED Notes (Addendum)
MD Scotty CourtStafford at bedside. Pt able to speak at this time and A&O x4.

## 2017-05-29 NOTE — ED Provider Notes (Signed)
[facility] Emergency Department Provider Note  ____________________________________________  Time seen: Approximately 4:09 PM  I have reviewed the triage vital signs and the nursing notes.   HISTORY  Chief Complaint Loss of Consciousness    HPI Melinda Maldonado is a 26 y.o. female brought to the ED from her workplace due to an episode of loss of consciousness. She reports that she was standing with a coworker having a conversation when she became very lightheaded. Put her head down and felt like she was blacking out but she could still hear every around her talking about her. She just felt like she was to tired and out of it to respond but she feels like she did not completely lose consciousness. No preceding symptoms such as headache chest pain shortness of breath abdominal pain. Since her. Was a week ago which is normal timing for her. Does have a history of anemia and has been on increased iron supplements recently. Currently symptoms are resolved. No neck pain or stiffness. No fever or chills.  Past Medical History:  Diagnosis Date  . Allergy   . Anemia   . Anxiety   . Asthma      Patient Active Problem List   Diagnosis Date Noted  . 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 No pertinent surgical history.   Prior to Admission medications   Medication Sig Start Date End Date Taking? Authorizing Provider  albuterol (PROVENTIL HFA;VENTOLIN HFA) 108 (90 BASE) MCG/ACT inhaler Inhale 1-2 puffs into the lungs every 4 (four) hours as needed for wheezing or shortness of breath. 07/16/15   Payton Mccallum, MD  Minocycline HCl (SOLODYN) 65 MG TB24 Take 1 tablet by mouth daily.    [provider]  montelukast (SINGULAIR) 10 MG tablet Take 1 tablet (10 mg total) by mouth at bedtime. 08/30/13   [name], MD  nitrofurantoin (MACRODANTIN) 100 MG capsule Take 1 capsule (100 mg total) by mouth 2 (two) times daily. 05/29/17   Sharman Cheek,  MD  predniSONE (DELTASONE) 10 MG tablet Take 2 tablets (20 mg total) by mouth daily. Take 4 tablets by mouth daily x5 days 06/12/14   Lorre Nick, MD  Vitamin D, Ergocalciferol, (DRISDOL) 50000 UNITS CAPS Take 100,000 Units by mouth every 7 (seven) days. On Tuesdays    [provider]     Allergies Keflex [cephalexin]; Banana; Eggs or egg-derived products; Latex; and Sulfa antibiotics   Family History  Problem Relation Age of Onset  . Asthma Sister   . Asthma Daughter   . Cancer Paternal Grandmother     Social History Social History  Substance Use Topics  . Smoking status: Never Smoker  . Smokeless tobacco: Never Used  . Alcohol use No    Review of Systems  Constitutional:   No fever or chills. Malaise recently ENT:   No sore throat. No rhinorrhea. Cardiovascular:   No chest pain or syncope. Respiratory:   No dyspnea or cough. Gastrointestinal:   Negative for abdominal pain, vomiting and diarrhea.  Musculoskeletal:   Negative for focal pain or swelling All other systems reviewed and are negative except as documented above in ROS and HPI.  ____________________________________________   PHYSICAL EXAM:  VITAL SIGNS: ED Triage Vitals  Enc Vitals Group     BP 05/29/17 1302 123/79     Pulse Rate 05/29/17 1258 73     Resp 05/29/17 1258 16     Temp 05/29/17 1258 98.8 F (37.1 C)  Temp Source 05/29/17 1258 Oral     SpO2 05/29/17 1256 100 %     Weight --      Height --      Head Circumference --      Peak Flow --      Pain Score --      Pain Loc --      Pain Edu? --      Excl. in GC? --     Vital signs reviewed, nursing assessments reviewed.   Constitutional:   Alert and oriented. Well appearing and in no distress. Eyes:   No scleral icterus.  EOMI. No nystagmus. No conjunctival pallor. PERRL. ENT   Head:   Normocephalic and atraumatic.   Nose:   No congestion/rhinnorhea.    Mouth/Throat:   MMM, no pharyngeal erythema. No peritonsillar  mass.    Neck:   No meningismus. Full ROM Hematological/Lymphatic/Immunilogical:   No cervical lymphadenopathy. Cardiovascular:   RRR. Symmetric bilateral radial and DP pulses.  No murmurs.  Respiratory:   Normal respiratory effort without tachypnea/retractions. Breath sounds are clear and equal bilaterally. No wheezes/rales/rhonchi. Gastrointestinal:   Soft and nontender. Non distended. There is no CVA tenderness.  No rebound, rigidity, or guarding. Genitourinary:   deferred Musculoskeletal:   Normal range of motion in all extremities. No joint effusions.  No lower extremity tenderness.  No edema. Neurologic:   Normal speech and language.  Motor grossly intact. No gross focal neurologic deficits are appreciated.  Skin:    Skin is warm, dry and intact. No rash noted.  No petechiae, purpura, or bullae.  ____________________________________________    LABS (pertinent positives/negatives) (all labs ordered are listed, but only abnormal results are displayed) Labs Reviewed  CBC WITH DIFFERENTIAL/PLATELET - Abnormal; Notable for the following:       Result Value   Hemoglobin 11.7 (*)    RDW 17.0 (*)    All other components within normal limits  URINALYSIS, COMPLETE (UACMP) WITH MICROSCOPIC - Abnormal; Notable for the following:    Color, Urine YELLOW (*)    APPearance HAZY (*)    Ketones, ur 5 (*)    Leukocytes, UA LARGE (*)    Bacteria, UA MANY (*)    Squamous Epithelial / LPF 0-5 (*)    All other components within normal limits  URINE CULTURE  BASIC METABOLIC PANEL  POC URINE PREG, ED  POCT PREGNANCY, URINE   ____________________________________________   EKG  Interpreted by me Sinus rhythm rate of 68, normal axis and intervals. Normal QRS ST segments and T-wave. There is a benign early repolarization pattern.  ____________________________________________    RADIOLOGY  No results  found.  ____________________________________________   PROCEDURES Procedures  ____________________________________________   INITIAL IMPRESSION / ASSESSMENT AND PLAN / ED COURSE  Pertinent labs & imaging results that were available during my care of the patient were reviewed by me and considered in my medical decision making (see chart for details).  Patient well appearing no acute distress, presents with episode likely syncope, vasovagal versus low likelihood seizure. Recommended follow-up with neurology for further assessment. Labs do reveal a likely urinary tract infection although she is a symptomatic. I'll treat her with Macrobid, urine culture sent. Follow up with primary care. No evidence of significant trauma.      ____________________________________________   FINAL CLINICAL IMPRESSION(S) / ED DIAGNOSES  Final diagnoses:  Lower urinary tract infectious disease  Syncope, unspecified syncope type      New Prescriptions   NITROFURANTOIN (MACRODANTIN) 100 MG  CAPSULE    Take 1 capsule (100 mg total) by mouth 2 (two) times daily.     Portions of this note were generated with dragon dictation software. Dictation errors may occur despite best attempts at proofreading.    Sharman CheekStafford, Sahaana Weitman, MD 05/29/17 [phone]

## 2017-05-29 NOTE — ED Notes (Signed)
Pt verbalized understanding of discharge instructions. NAD at this time. 

## 2017-05-29 NOTE — Discharge Instructions (Signed)
Your tests today show a urinary tract infection, which may have contributed to today's episode. Take the antibiotic to treat this. Follow up with neurology for further assessment of possible seizures.  Results for orders placed or performed during the hospital encounter of 05/29/17  Basic metabolic panel  Result Value Ref Range   Sodium 138 135 - 145 mmol/L   Potassium 3.8 3.5 - 5.1 mmol/L   Chloride 105 101 - 111 mmol/L   CO2 24 22 - 32 mmol/L   Glucose, Bld 79 65 - 99 mg/dL   BUN 9 6 - 20 mg/dL   Creatinine, Ser 1.610.84 0.44 - 1.00 mg/dL   Calcium 9.8 8.9 - 09.610.3 mg/dL   GFR calc non Af Amer >60 >60 mL/min   GFR calc Af Amer >60 >60 mL/min   Anion gap 9 5 - 15  CBC with Differential  Result Value Ref Range   WBC 8.7 3.6 - 11.0 K/uL   RBC 4.44 3.80 - 5.20 MIL/uL   Hemoglobin 11.7 (L) 12.0 - 16.0 g/dL   HCT 04.535.8 40.935.0 - 81.147.0 %   MCV 80.7 80.0 - 100.0 fL   MCH 26.5 26.0 - 34.0 pg   MCHC 32.8 32.0 - 36.0 g/dL   RDW 91.417.0 (H) 78.211.5 - 95.614.5 %   Platelets 409 150 - 440 K/uL   Neutrophils Relative % 60 %   Neutro Abs 5.3 1.4 - 6.5 K/uL   Lymphocytes Relative 27 %   Lymphs Abs 2.3 1.0 - 3.6 K/uL   Monocytes Relative 9 %   Monocytes Absolute 0.7 0.2 - 0.9 K/uL   Eosinophils Relative 3 %   Eosinophils Absolute 0.3 0 - 0.7 K/uL   Basophils Relative 1 %   Basophils Absolute 0.1 0 - 0.1 K/uL  Urinalysis, Complete w Microscopic  Result Value Ref Range   Color, Urine YELLOW (A) YELLOW   APPearance HAZY (A) CLEAR   Specific Gravity, Urine 1.021 1.005 - 1.030   pH 5.0 5.0 - 8.0   Glucose, UA NEGATIVE NEGATIVE mg/dL   Hgb urine dipstick NEGATIVE NEGATIVE   Bilirubin Urine NEGATIVE NEGATIVE   Ketones, ur 5 (A) NEGATIVE mg/dL   Protein, ur NEGATIVE NEGATIVE mg/dL   Nitrite NEGATIVE NEGATIVE   Leukocytes, UA LARGE (A) NEGATIVE   RBC / HPF 6-30 0 - 5 RBC/hpf   WBC, UA 6-30 0 - 5 WBC/hpf   Bacteria, UA MANY (A) NONE SEEN   Squamous Epithelial / LPF 0-5 (A) NONE SEEN   Mucous PRESENT     Pregnancy, urine POC  Result Value Ref Range   Preg Test, Ur NEGATIVE NEGATIVE   No results found.

## 2017-05-29 NOTE — ED Notes (Signed)
Signature pad not working. Hard copy printed and signed by the pt.

## 2017-05-31 LAB — URINE CULTURE

## 2017-06-02 ENCOUNTER — Emergency Department (HOSPITAL_BASED_OUTPATIENT_CLINIC_OR_DEPARTMENT_OTHER): Payer: Medicaid Other

## 2017-06-02 ENCOUNTER — Encounter (HOSPITAL_COMMUNITY): Payer: Self-pay | Admitting: Emergency Medicine

## 2017-06-02 ENCOUNTER — Emergency Department (HOSPITAL_BASED_OUTPATIENT_CLINIC_OR_DEPARTMENT_OTHER)
Admission: EM | Admit: 2017-06-02 | Discharge: 2017-06-02 | Disposition: A | Discharge status: Home / Self Care | Payer: Medicaid Other | Attending: Emergency Medicine | Admitting: Emergency Medicine

## 2017-06-02 ENCOUNTER — Encounter (HOSPITAL_BASED_OUTPATIENT_CLINIC_OR_DEPARTMENT_OTHER): Payer: Self-pay

## 2017-06-02 ENCOUNTER — Emergency Department (HOSPITAL_BASED_OUTPATIENT_CLINIC_OR_DEPARTMENT_OTHER)
Admission: EM | Admit: 2017-06-02 | Discharge: 2017-06-03 | Disposition: A | Discharge status: Home / Self Care | Payer: Medicaid Other | Source: Home / Self Care | Attending: Emergency Medicine | Admitting: Emergency Medicine

## 2017-06-02 DIAGNOSIS — R55 Syncope and collapse: Secondary | ICD-10-CM | POA: Insufficient documentation

## 2017-06-02 DIAGNOSIS — Z91040 Latex allergy status: Secondary | ICD-10-CM | POA: Insufficient documentation

## 2017-06-02 DIAGNOSIS — J45909 Unspecified asthma, uncomplicated: Secondary | ICD-10-CM | POA: Diagnosis not present

## 2017-06-02 DIAGNOSIS — Z79899 Other long term (current) drug therapy: Secondary | ICD-10-CM | POA: Diagnosis not present

## 2017-06-02 DIAGNOSIS — R51 Headache: Secondary | ICD-10-CM | POA: Diagnosis not present

## 2017-06-02 LAB — CBC
HCT: 31.6 % — ABNORMAL LOW (ref 36.0–46.0)
HEMOGLOBIN: 10 g/dL — AB (ref 12.0–15.0)
MCH: 26.3 pg (ref 26.0–34.0)
MCHC: 31.6 g/dL (ref 30.0–36.0)
MCV: 83.2 fL (ref 78.0–100.0)
Platelets: 370 10*3/uL (ref 150–400)
RBC: 3.8 MIL/uL — AB (ref 3.87–5.11)
RDW: 15.8 % — ABNORMAL HIGH (ref 11.5–15.5)
WBC: 9.9 10*3/uL (ref 4.0–10.5)

## 2017-06-02 LAB — BASIC METABOLIC PANEL
ANION GAP: 9 (ref 5–15)
BUN: 10 mg/dL (ref 6–20)
CALCIUM: 9.2 mg/dL (ref 8.9–10.3)
CO2: 21 mmol/L — ABNORMAL LOW (ref 22–32)
Chloride: 108 mmol/L (ref 101–111)
Creatinine, Ser: 0.91 mg/dL (ref 0.44–1.00)
Glucose, Bld: 76 mg/dL (ref 65–99)
Potassium: 3.5 mmol/L (ref 3.5–5.1)
Sodium: 138 mmol/L (ref 135–145)

## 2017-06-02 LAB — PREGNANCY, URINE: Preg Test, Ur: NEGATIVE

## 2017-06-02 LAB — CBG MONITORING, ED
GLUCOSE-CAPILLARY: 78 mg/dL (ref 65–99)
GLUCOSE-CAPILLARY: 87 mg/dL (ref 65–99)

## 2017-06-02 MED ORDER — SODIUM CHLORIDE 0.9 % IV BOLUS (SEPSIS)
1000.0000 mL | Freq: Once | INTRAVENOUS | Status: AC
  Administered 2017-06-03: 1000 mL via INTRAVENOUS

## 2017-06-02 NOTE — ED Provider Notes (Signed)
TIME SEEN: 11:33 PM  CHIEF COMPLAINT: Syncope  HPI: Patient is a 26 year old female with history of anemia, asthma, anxiety who presents to the emergency department with reports of several syncopal events over the past week. States that sometimes she is standing when these happen and sometimes she is sitting down. She states that time she does feel lightheaded with these episodes but no chest pain or chest discomfort, shortness of breath, headache. Her last episode she reports was in the [facility] emergency department waiting room. She left there without being seen and presented here for further evaluation. States that she has hit her head during the previous syncopal events is having a throbbing headache but no headache previous to this. No numbness, tingling or focal weakness. Her last menstrual period was 2 weeks ago. No vomiting, diarrhea, bloody stools or melena. No history of PE or DVT. She states that these episodes are unwitnessed. There is no known seizure activity but she states she does feel confused afterwards. No family history of sudden cardiac death. Patient does not have any history of arrhythmia, CAD, CHF, seizures. No family history of epilepsy. Patient was just seen at [facility] for similar episode. At that time she felt lightheaded and had a near syncopal event but stated she could hear everyone talking around her and she felt out of bed but did not completely lose consciousness. Labs at that time unremarkable. Hemoglobin 11.7, normal electrolytes. Negative pregnancy test. She is being treated with Macrobid for a urinary tract infection.  ROS: See HPI Constitutional: no fever  Eyes: no drainage  ENT: no runny nose   Cardiovascular:  no chest pain  Resp: no SOB  GI: no vomiting GU: no dysuria Integumentary: no rash  Allergy: no hives  Musculoskeletal: no leg swelling  Neurological: no slurred speech ROS otherwise negative  PAST MEDICAL HISTORY/PAST SURGICAL  HISTORY:  Past Medical History:  Diagnosis Date  . Allergy   . Anemia   . Anxiety   . Asthma     MEDICATIONS:  Prior to Admission medications   Medication Sig Start Date End Date Taking? Authorizing Provider  albuterol (PROVENTIL HFA;VENTOLIN HFA) 108 (90 BASE) MCG/ACT inhaler Inhale 1-2 puffs into the lungs every 4 (four) hours as needed for wheezing or shortness of breath. 07/16/15   Payton Mccallumonty, [place], MD  Minocycline HCl (SOLODYN) 65 MG TB24 Take 1 tablet by mouth daily.    [provider]  montelukast (SINGULAIR) 10 MG tablet Take 1 tablet (10 mg total) by mouth at bedtime. 08/30/13   Tonye Pearsonoolittle, Robert P, MD  nitrofurantoin (MACRODANTIN) 100 MG capsule Take 1 capsule (100 mg total) by mouth 2 (two) times daily. 05/29/17   Sharman CheekStafford, Phillip, MD  predniSONE (DELTASONE) 10 MG tablet Take 2 tablets (20 mg total) by mouth daily. Take 4 tablets by mouth daily x5 days 06/12/14   Lorre NickAllen, Anthony, MD  Vitamin D, Ergocalciferol, (DRISDOL) 50000 UNITS CAPS Take 100,000 Units by mouth every 7 (seven) days. On Tuesdays    [provider]    ALLERGIES:  Allergies  Allergen Reactions  . Keflex [Cephalexin] Anaphylaxis  . Banana Hives  . Eggs Or Egg-Derived Products Other (See Comments)    Body gets hot  . Latex Itching    Rash, blistering and burning  . Sulfa Antibiotics     SOCIAL HISTORY:  Social History  Substance Use Topics  . Smoking status: Never Smoker  . Smokeless tobacco: Never Used  . Alcohol use No  FAMILY HISTORY: Family History  Problem Relation Age of Onset  . Asthma Sister   . Asthma Daughter   . Cancer Paternal Grandmother     EXAM: BP 121/80 (BP Location: Right Arm)   Pulse 71   Temp 98.3 F (36.8 C) (Oral)   Resp 16   Ht 5' (1.524 m)   Wt 66.7 kg (147 lb)   LMP 05/19/2017   SpO2 100%   BMI 28.71 kg/m  CONSTITUTIONAL: Alert and oriented and responds appropriately to questions. Well-appearing; well-nourished; GCS 15 HEAD: Normocephalic;  atraumatic EYES: Conjunctivae clear, PERRL, EOMI ENT: normal nose; no rhinorrhea; moist mucous membranes; pharynx without lesions noted; no dental injury; no septal hematoma NECK: Supple, no meningismus, no LAD; no midline spinal tenderness, step-off or deformity; trachea midline CARD: RRR; S1 and S2 appreciated; no murmurs, no clicks, no rubs, no gallops RESP: Normal chest excursion without splinting or tachypnea; breath sounds clear and equal bilaterally; no wheezes, no rhonchi, no rales; no hypoxia or respiratory distress CHEST:  chest wall stable, no crepitus or ecchymosis or deformity, nontender to palpation; no flail chest ABD/GI: Normal bowel sounds; non-distended; soft, non-tender, no rebound, no guarding; no ecchymosis or other lesions noted PELVIS:  stable, nontender to palpation BACK:  The back appears normal and is non-tender to palpation, there is no CVA tenderness; no midline spinal step-off or deformity, patient reports she is tender over the lower lumbar spine with palpation EXT: Normal ROM in all joints; non-tender to palpation; no edema; normal capillary refill; no cyanosis, no bony tenderness or bony deformity of patient's extremities, no joint effusion, compartments are soft, extremities are warm and well-perfused, no ecchymosis SKIN: Normal color for age and race; warm NEURO: Moves all extremities equally; Strength 5/5 in all four extremities.  Normal sensation diffusely.  CN 2-12 grossly intact.  No dysmetria to finger to nose testing bilaterally.  Normal speech.  Normal gait.  Patient gives poor effort and has to be encouraged multiple times. PSYCH: The patient's mood and manner are appropriate. Grooming and personal hygiene are appropriate.  MEDICAL DECISION MAKING: Patient here with reports of multiple syncopal events. She just had unremarkable labs on the 27th. I do not feel these need to be repeated. Her EKG here is completely normal without Delta wave, Brugada, LVH,  arrhythmia, interval changes. Blood glucose is normal.  Will check urinalysis and obtain a CT of her head and x-ray of her lumbar spine given she reports she has fallen to the ground with these episodes. I have advised her that if these episodes are happening spontaneously that she should not be driving a vehicle until she has been seen and cleared by outpatient physician. I do recommend close follow-up with a primary care doctor as well as a neurologist. These do seem very atypical for seizures but she states that afterwards she feels very confused. There is no one at the bedside at this time that has witnessed these events that can confirm if there is any postictal state. There is no incontinence or tongue biting. I do not feel we need to start her on antiepileptics from the emergency room.  ED PROGRESS: CT of the head and x-ray of the lumbar spine are unremarkable. She is not pregnant. She has been able to ambulate here without difficulty, a drink without difficulty. I have recommended close outpatient follow-up and again she does not drive as she puts herself and others at risk at this time until she is cleared by outpatient doctor. I  have low suspicion that this is cardiac in nature given she has no associated chest pain, shortness of breath and has a normal EKG. I do not think this is a PE or dissection. She remains neurologically intact. She is not orthostatic. We did discuss return precautions. Patient and family at bedside are comfortable with this plan.  At this time, I do not feel there is any life-threatening condition present. I have reviewed and discussed all results (EKG, imaging, lab, urine as appropriate) and exam findings with patient/family. I have reviewed nursing notes and appropriate previous records.  I feel the patient is safe to be discharged home without further emergent workup and can continue workup as an outpatient as needed. Discussed usual and customary return precautions.  Patient/family verbalize understanding and are comfortable with this plan.  Outpatient follow-up has been provided if needed. All questions have been answered.     EKG Interpretation  Date/Time:  Tuesday June 02 2017 23:34:39 EDT Ventricular Rate:  78 PR Interval:    QRS Duration: 87 QT Interval:  375 QTC Calculation: 428 R Axis:   49 Text Interpretation:  Sinus rhythm Confirmed by Rochele Raring [id number] on 06/03/2017 2:46:32 AM           Kechia Yahnke, Layla Maw, DO 06/03/17 0522

## 2017-06-02 NOTE — ED Notes (Signed)
Pt went to  [facility] last Tuesday thinking that she had a yeast infection and was prescribed with Fluconazole for yeast.  Last Friday @ noon, pt was talking to her co-worker and she passed out. The ambulance took her to [facility].  Macrobid was prescribed for UTI and she stated that she was having seizure.  Blood work and CT scan was done.  After it was resulted, she was sent home. They advised her to see a neurologist. She went to [facility] d/t her stomach and back feeling on fire and passed out in the [facility].   She was at the Triage room and she stated that she left without the staff knowing that she left.

## 2017-06-02 NOTE — ED Notes (Signed)
Pt not in room.

## 2017-06-02 NOTE — ED Triage Notes (Signed)
Pt c/o "fainting spells" for the last week, was seen at [facility] and was at [facility] earlier but LWBS, states she "passed out" while waiting in the lobby, recently dx'd with UTI, has two more days of abx, c/o lower abdominal pain, no fevers at home, states she felt fine until about 1700 today, pt denies chest pain and SOB

## 2017-06-02 NOTE — ED Triage Notes (Signed)
Pt came in to registration and checked in for syncope  Pt told the registration clerk last time she was here she had a seizure and passed out and they had to revive her  Pt called to triage and as she was walking in she gently fell in the floor in front of the door  Pt assisted to sitting position arms remained stiffly out in front of her but pt's eyes remain closed and pt nonverbal  Pt assisted to wheelchair and put in triage room  Pt will not answer any questions to this writer  Pt would open eyes and softly whisper non comprehensible words

## 2017-06-02 NOTE — ED Notes (Signed)
Pt did not return to room

## 2017-06-02 NOTE — ED Notes (Signed)
Pt lying flat for 10 minutes before orthostatic vs can be done. 

## 2017-06-03 NOTE — ED Notes (Signed)
Pt verbalizes understanding of d/c instructions and denies any further needs at this time. 

## 2017-06-03 NOTE — Discharge Instructions (Signed)
Please do not drive a car until you have been seen and cleared by a neurologist.

## 2017-06-09 ENCOUNTER — Ambulatory Visit: Payer: Self-pay | Admitting: Neurology

## 2017-06-10 ENCOUNTER — Encounter: Payer: Self-pay | Admitting: Neurology

## 2019-10-06 ENCOUNTER — Other Ambulatory Visit: Payer: Self-pay | Admitting: Cardiology

## 2019-10-06 DIAGNOSIS — Z20822 Contact with and (suspected) exposure to covid-19: Secondary | ICD-10-CM

## 2019-10-09 LAB — NOVEL CORONAVIRUS, NAA: SARS-CoV-2, NAA: NOT DETECTED

## 2019-11-05 ENCOUNTER — Emergency Department (HOSPITAL_BASED_OUTPATIENT_CLINIC_OR_DEPARTMENT_OTHER)
Admission: EM | Admit: 2019-11-05 | Discharge: 2019-11-05 | Disposition: A | Discharge status: Home / Self Care | Payer: BC Managed Care – PPO | Attending: Emergency Medicine | Admitting: Emergency Medicine

## 2019-11-05 ENCOUNTER — Encounter (HOSPITAL_BASED_OUTPATIENT_CLINIC_OR_DEPARTMENT_OTHER): Payer: Self-pay

## 2019-11-05 ENCOUNTER — Other Ambulatory Visit: Payer: Self-pay

## 2019-11-05 ENCOUNTER — Emergency Department (HOSPITAL_BASED_OUTPATIENT_CLINIC_OR_DEPARTMENT_OTHER): Payer: BC Managed Care – PPO

## 2019-11-05 DIAGNOSIS — Z91040 Latex allergy status: Secondary | ICD-10-CM | POA: Insufficient documentation

## 2019-11-05 DIAGNOSIS — B9689 Other specified bacterial agents as the cause of diseases classified elsewhere: Secondary | ICD-10-CM | POA: Insufficient documentation

## 2019-11-05 DIAGNOSIS — N760 Acute vaginitis: Secondary | ICD-10-CM | POA: Diagnosis not present

## 2019-11-05 DIAGNOSIS — R1030 Lower abdominal pain, unspecified: Secondary | ICD-10-CM | POA: Insufficient documentation

## 2019-11-05 DIAGNOSIS — Z79899 Other long term (current) drug therapy: Secondary | ICD-10-CM | POA: Diagnosis not present

## 2019-11-05 DIAGNOSIS — N939 Abnormal uterine and vaginal bleeding, unspecified: Secondary | ICD-10-CM

## 2019-11-05 DIAGNOSIS — O26899 Other specified pregnancy related conditions, unspecified trimester: Secondary | ICD-10-CM | POA: Diagnosis present

## 2019-11-05 DIAGNOSIS — O4690 Antepartum hemorrhage, unspecified, unspecified trimester: Secondary | ICD-10-CM | POA: Insufficient documentation

## 2019-11-05 LAB — WET PREP, GENITAL
Sperm: NONE SEEN
Trich, Wet Prep: NONE SEEN
Yeast Wet Prep HPF POC: NONE SEEN

## 2019-11-05 LAB — CBC WITH DIFFERENTIAL/PLATELET
Abs Immature Granulocytes: 0.04 10*3/uL (ref 0.00–0.07)
Basophils Absolute: 0 10*3/uL (ref 0.0–0.1)
Basophils Relative: 0 %
Eosinophils Absolute: 0.2 10*3/uL (ref 0.0–0.5)
Eosinophils Relative: 2 %
HCT: 31.8 % — ABNORMAL LOW (ref 36.0–46.0)
Hemoglobin: 9.5 g/dL — ABNORMAL LOW (ref 12.0–15.0)
Immature Granulocytes: 0 %
Lymphocytes Relative: 18 %
Lymphs Abs: 1.8 10*3/uL (ref 0.7–4.0)
MCH: 24.8 pg — ABNORMAL LOW (ref 26.0–34.0)
MCHC: 29.9 g/dL — ABNORMAL LOW (ref 30.0–36.0)
MCV: 83 fL (ref 80.0–100.0)
Monocytes Absolute: 0.8 10*3/uL (ref 0.1–1.0)
Monocytes Relative: 8 %
Neutro Abs: 7.1 10*3/uL (ref 1.7–7.7)
Neutrophils Relative %: 72 %
Platelets: 464 10*3/uL — ABNORMAL HIGH (ref 150–400)
RBC: 3.83 MIL/uL — ABNORMAL LOW (ref 3.87–5.11)
RDW: 16.4 % — ABNORMAL HIGH (ref 11.5–15.5)
WBC: 10 10*3/uL (ref 4.0–10.5)
nRBC: 0 % (ref 0.0–0.2)

## 2019-11-05 LAB — COMPREHENSIVE METABOLIC PANEL
ALT: 9 U/L (ref 0–44)
AST: 18 U/L (ref 15–41)
Albumin: 4.2 g/dL (ref 3.5–5.0)
Alkaline Phosphatase: 73 U/L (ref 38–126)
Anion gap: 9 (ref 5–15)
BUN: 7 mg/dL (ref 6–20)
CO2: 23 mmol/L (ref 22–32)
Calcium: 9 mg/dL (ref 8.9–10.3)
Chloride: 106 mmol/L (ref 98–111)
Creatinine, Ser: 0.85 mg/dL (ref 0.44–1.00)
GFR calc Af Amer: >60 mL/min (ref >60)
GFR calc non Af Amer: >60 mL/min (ref >60)
Glucose, Bld: 105 mg/dL — ABNORMAL HIGH (ref 70–99)
Potassium: 3.8 mmol/L (ref 3.5–5.1)
Sodium: 138 mmol/L (ref 135–145)
Total Bilirubin: 0.3 mg/dL (ref 0.3–1.2)
Total Protein: 7.7 g/dL (ref 6.5–8.1)

## 2019-11-05 LAB — ABO/RH: ABO/RH(D): O POS

## 2019-11-05 LAB — HCG, QUANTITATIVE, PREGNANCY: hCG, Beta Chain, Quant, S: 2310 m[IU]/mL — ABNORMAL HIGH (ref <5)

## 2019-11-05 MED ORDER — METRONIDAZOLE 500 MG PO TABS: 500.0000 mg | ORAL_TABLET | Freq: Two times a day (BID) | ORAL | 0 refills | Status: AC

## 2019-11-05 MED ORDER — HYDROCODONE-ACETAMINOPHEN 5-325 MG PO TABS: 1.0000 | ORAL_TABLET | ORAL | 0 refills | Status: AC | PRN

## 2019-11-05 MED ORDER — HYDROCODONE-ACETAMINOPHEN 5-325 MG PO TABS
1.0000 | ORAL_TABLET | Freq: Once | ORAL | Status: DC
  Filled 2019-11-05: qty 1

## 2019-11-05 NOTE — ED Notes (Signed)
Pt made aware of the need for urine sample.  

## 2019-11-05 NOTE — Discharge Instructions (Signed)
As we discussed there is no pregnancy seen on your ultrasound.  This may be a miscarriage, and this may be a normal pregnancy or this may be an ectopic pregnancy.  You need to have a repeat blood test in 48 hours with the [facility] doctor.  Return to the ED with worsening pain, bleeding, fever, vomiting, or other concerns.

## 2019-11-05 NOTE — ED Provider Notes (Signed)
[facility] EMERGENCY DEPARTMENT Provider Note   CSN: [id number] Arrival date & time: 11/05/19  1839     History Chief Complaint  Patient presents with  . Abdominal Cramping    Melinda Maldonado is a 29 y.o. female.  G2, P1 at approximately [redacted] weeks gestation.  Last menstrual cycle was November 24.  She presents tonight with lower abdominal cramping and vaginal bleeding that onset about 5 PM.  States has been spotting for approximately the past 1 week bleeding became more significant today and associated with lower abdominal cramping.  States she used 1 pad at home.  Does feel somewhat dizzy and lightheaded.  Lower abdominal cramping feels like menstrual cramps.  There is no chest pain or shortness of breath.  She does feel somewhat dizzy.  States she has not had an ultrasound for this pregnancy.  Her OB is in the [place] and she has not established care with him yet.  Has a history of anemia and anxiety.  She took Tylenol at home with partial relief.  No recent vomiting.  Last bowel movement was 2 days ago.  No previous abdominal surgeries  The history is provided by the patient.  Abdominal Cramping Pertinent negatives include no chest pain, no abdominal pain, no headaches and no shortness of breath.       Past Medical History:  Diagnosis Date  . Allergy   . Anemia   . Anxiety   . Asthma     Patient Active Problem List   Diagnosis Date Noted  . 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 No pertinent surgical history.   OB History    Gravida  3   Para  1   Term  1   Preterm      AB  2   Living  1     SAB  1   TAB  1   Ectopic      Multiple      Live Births              Family History  Problem Relation Age of Onset  . Asthma Sister   . Asthma Daughter   . Cancer Paternal Grandmother     Social History   Tobacco Use  . Smoking status: Never Smoker  . Smokeless tobacco: Never Used  Substance Use Topics  . Alcohol use: No  . Drug  use: No    Home Medications Prior to Admission medications   Medication Sig Start Date End Date Taking? Authorizing Provider  albuterol (PROVENTIL HFA;VENTOLIN HFA) 108 (90 BASE) MCG/ACT inhaler Inhale 1-2 puffs into the lungs every 4 (four) hours as needed for wheezing or shortness of breath. 07/16/15   Payton Mccallum, MD  Minocycline HCl (SOLODYN) 65 MG TB24 Take 1 tablet by mouth daily.    [provider]  montelukast (SINGULAIR) 10 MG tablet Take 1 tablet (10 mg total) by mouth at bedtime. 08/30/13   [name], MD  nitrofurantoin (MACRODANTIN) 100 MG capsule Take 1 capsule (100 mg total) by mouth 2 (two) times daily. 05/29/17   Sharman Cheek, MD  predniSONE (DELTASONE) 10 MG tablet Take 2 tablets (20 mg total) by mouth daily. Take 4 tablets by mouth daily x5 days 06/12/14   Lorre Nick, MD  Vitamin D, Ergocalciferol, (DRISDOL) 50000 UNITS CAPS Take 100,000 Units by mouth every 7 (seven) days. On Tuesdays    [provider]    Allergies    Keflex [cephalexin], Banana, Eggs or  egg-derived products, Latex, and Sulfa antibiotics  Review of Systems   Review of Systems  Constitutional: Negative for activity change, appetite change, fatigue and fever.  HENT: Negative for congestion and rhinorrhea.   Eyes: Negative for visual disturbance.  Respiratory: Negative for cough, chest tightness and shortness of breath.   Cardiovascular: Negative for chest pain.  Gastrointestinal: Negative for abdominal pain, nausea and vomiting.  Genitourinary: Positive for vaginal bleeding. Negative for dysuria and hematuria.  Musculoskeletal: Positive for back pain. Negative for arthralgias and myalgias.  Neurological: Positive for dizziness and light-headedness. Negative for weakness and headaches.   all other systems are negative except as noted in the HPI and PMH.    Physical Exam Updated Vital Signs BP 121/72   Pulse 84   Temp 98.6 F (37 C) (Oral)   Resp (!) 21   Ht  5' (1.524 m)   Wt 72.6 kg   LMP 09/27/2019   SpO2 100%   BMI 31.25 kg/m   Physical Exam Vitals and nursing note reviewed.  Constitutional:      General: She is not in acute distress.    Appearance: She is well-developed.     Comments: Anxious appearing  HENT:     Head: Normocephalic and atraumatic.     Mouth/Throat:     Pharynx: No oropharyngeal exudate.  Eyes:     Conjunctiva/sclera: Conjunctivae normal.     Pupils: Pupils are equal, round, and reactive to light.  Neck:     Comments: No meningismus. Cardiovascular:     Rate and Rhythm: Normal rate and regular rhythm.     Heart sounds: Normal heart sounds. No murmur.  Pulmonary:     Effort: Pulmonary effort is normal. No respiratory distress.     Breath sounds: Normal breath sounds.  Abdominal:     General: There is distension.     Palpations: Abdomen is soft.     Tenderness: There is abdominal tenderness. There is no guarding or rebound.     Comments: Periumbilical tenderness, no guarding or rebound, abdomen soft somewhat distended.  Genitourinary:    Comments: Chaperone present.  Normal female genitalia.  Dark blood in vaginal vault.  Scant blood from cervix.  No CMT.  No lateralizing adnexal tenderness Cervix is closed Musculoskeletal:        General: No tenderness. Normal range of motion.     Cervical back: Normal range of motion and neck supple.  Skin:    General: Skin is warm.     Capillary Refill: Capillary refill takes less than 2 seconds.  Neurological:     General: No focal deficit present.     Mental Status: She is alert and oriented to person, place, and time. Mental status is at baseline.     Cranial Nerves: No cranial nerve deficit.     Motor: No abnormal muscle tone.     Coordination: Coordination normal.     Comments:  5/5 strength throughout. CN 2-12 intact.Equal grip strength.   Psychiatric:        Behavior: Behavior normal.     ED Results / Procedures / Treatments   Labs (all labs ordered are  listed, but only abnormal results are displayed) Labs Reviewed  WET PREP, GENITAL - Abnormal; Notable for the following components:      Result Value   Clue Cells Wet Prep HPF POC PRESENT (*)    WBC, Wet Prep HPF POC FEW (*)    All other components within normal limits  CBC WITH  DIFFERENTIAL/PLATELET - Abnormal; Notable for the following components:   RBC 3.83 (*)    Hemoglobin 9.5 (*)    HCT 31.8 (*)    MCH 24.8 (*)    MCHC 29.9 (*)    RDW 16.4 (*)    Platelets 464 (*)    All other components within normal limits  COMPREHENSIVE METABOLIC PANEL - Abnormal; Notable for the following components:   Glucose, Bld 105 (*)    All other components within normal limits  HCG, QUANTITATIVE, PREGNANCY - Abnormal; Notable for the following components:   hCG, Beta Chain, Quant, S 2,310 (*)    All other components within normal limits  URINALYSIS, ROUTINE W REFLEX MICROSCOPIC  ABO/RH  GC/CHLAMYDIA PROBE AMP ([place]) NOT AT [facility]    EKG None  Radiology US OB LESS THAN 14 WEEKS WITH OB TRANSVAGINAL  Result Date: 11/05/2019 CLINICAL DATA:  Pelvic cramping and vaginal bleeding EXAM: OBSTETRIC <14 WK [place] AND TRANSVAGINAL OB US TECHNIQUE: Both transabdominal and transvaginal ultrasound examinations were performed for complete evaluation of the gestation as well as the maternal uterus, adnexal regions, and pelvic cul-de-sac. Transvaginal technique was performed to assess early pregnancy. COMPARISON:  None. FINDINGS: Intrauterine gestational sac: Not seen Yolk sac:  Not seen Embryo:  Not seen Maternal uterus/adnexae: Multiple hypoechoic uterine masses. Left posterior uterine wall fibroid measures 3.9 x 3.8 x 3.6 cm. Left anterior uterine wall fibroid measures 1.9 x 1.6 x 1.9 cm. Ovaries are within normal limits. The right ovary measures 2.9 x 1.3 by 1.9 cm with a volume of 3.7 mL. The left ovary measures 2.3 x 2.8 x 1.7 cm with a volume of 5.7 mL. Small free fluid in the pelvis IMPRESSION: 1. No IUP  identified. Assuming positive pregnancy test, findings consistent with pregnancy of unknown location, differential of which includes IUP too early to visualize, recent failed pregnancy, and occult ectopic pregnancy. Recommend correlation/trending of HCG with repeat ultrasound as indicated 2. Multiple uterine fibroids. Small amount of free fluid in the pelvis Electronically Signed   By: Jasmine Pang M.D.   On: 11/05/2019 19:58    Procedures Procedures (including critical care time)  Medications Ordered in ED Medications - No data to display  ED Course  I have reviewed the triage vital signs and the nursing notes.  Pertinent labs & imaging results that were available during my care of the patient were reviewed by me and considered in my medical decision making (see chart for details).    MDM Rules/Calculators/A&P                      Lower abdominal pain with vaginal bleeding.  Reports positive pregnancy test at home and believes she is about 4 to [redacted] weeks pregnant.  Vitals are stable.  Hemoglobin is 9.4 which is slightly decreased from her previous.  hCG is 2300.  Ultrasound does not show any IUP.  Discussed with Dr. Debroah Loop of OB.  He agrees that this is pregnancy of undetermined location.  Patient needs follow-up hCG in 48 hours.  Discussed with patient this could be a normal pregnancy, could be miscarriage or could be ectopic pregnancy. Rh is positive.  She is not a RhoGam candidate  Her OB is in [place]  Patient feels improved.  She is tolerating p.o.  Pain is improved.  Discussed that any pain medication given will affect her pregnancy.  She is agreeable to follow-up for repeat hCG in 2 days either in the office here  or at [facility]. We will treat bacterial vaginosis.  Discussed coming back to the hospital with worsening pain, bleeding, vomiting or other concerns. Final Clinical Impression(s) / ED Diagnoses Final diagnoses:  Vaginal bleeding in pregnancy  Bacterial vaginosis      Rx / DC Orders ED Discharge Orders    None       Ryan Ogborn, Annie Main, MD 11/05/19 2324

## 2019-11-05 NOTE — ED Triage Notes (Addendum)
Pt states she is [redacted] weeks pregnant with hx of 1 Ab. Pt states she has had spotty bleeding x 1 week. Sudden onset of severe pelvic cramping tonight. Pt hyperventilating during triage.

## 2019-11-05 NOTE — ED Notes (Signed)
Pt in ultrasound at this time

## 2019-11-05 NOTE — ED Notes (Signed)
Pt in ultrasound prior to coming to room

## 2019-11-05 NOTE — ED Notes (Signed)
ED Provider at bedside. Dr Rancour 

## 2019-11-07 LAB — GC/CHLAMYDIA PROBE AMP (~~LOC~~) NOT AT ARMC
Chlamydia: NEGATIVE
Neisseria Gonorrhea: NEGATIVE

## 2020-12-12 ENCOUNTER — Emergency Department (HOSPITAL_COMMUNITY)
Admission: EM | Admit: 2020-12-12 | Discharge: 2020-12-12 | Disposition: A | Discharge status: Home / Self Care | Payer: BC Managed Care – PPO | Attending: Emergency Medicine | Admitting: Emergency Medicine

## 2020-12-12 ENCOUNTER — Other Ambulatory Visit: Payer: Self-pay

## 2020-12-12 ENCOUNTER — Emergency Department (HOSPITAL_COMMUNITY): Payer: BC Managed Care – PPO

## 2020-12-12 ENCOUNTER — Encounter (HOSPITAL_COMMUNITY): Payer: Self-pay

## 2020-12-12 DIAGNOSIS — R0789 Other chest pain: Secondary | ICD-10-CM | POA: Insufficient documentation

## 2020-12-12 DIAGNOSIS — R200 Anesthesia of skin: Secondary | ICD-10-CM | POA: Insufficient documentation

## 2020-12-12 DIAGNOSIS — J45909 Unspecified asthma, uncomplicated: Secondary | ICD-10-CM | POA: Insufficient documentation

## 2020-12-12 DIAGNOSIS — Z91040 Latex allergy status: Secondary | ICD-10-CM | POA: Insufficient documentation

## 2020-12-12 HISTORY — DX: Unspecified convulsions: R56.9

## 2020-12-12 LAB — CBC WITH DIFFERENTIAL/PLATELET
Abs Immature Granulocytes: 0.02 10*3/uL (ref 0.00–0.07)
Basophils Absolute: 0 10*3/uL (ref 0.0–0.1)
Basophils Relative: 0 %
Eosinophils Absolute: 0.3 10*3/uL (ref 0.0–0.5)
Eosinophils Relative: 4 %
HCT: 39 % (ref 36.0–46.0)
Hemoglobin: 12.6 g/dL (ref 12.0–15.0)
Immature Granulocytes: 0 %
Lymphocytes Relative: 24 %
Lymphs Abs: 1.9 10*3/uL (ref 0.7–4.0)
MCH: 30.9 pg (ref 26.0–34.0)
MCHC: 32.3 g/dL (ref 30.0–36.0)
MCV: 95.6 fL (ref 80.0–100.0)
Monocytes Absolute: 0.7 10*3/uL (ref 0.1–1.0)
Monocytes Relative: 9 %
Neutro Abs: 5 10*3/uL (ref 1.7–7.7)
Neutrophils Relative %: 63 %
Platelets: 370 10*3/uL (ref 150–400)
RBC: 4.08 MIL/uL (ref 3.87–5.11)
RDW: 12.8 % (ref 11.5–15.5)
WBC: 7.9 10*3/uL (ref 4.0–10.5)
nRBC: 0 % (ref 0.0–0.2)

## 2020-12-12 LAB — COMPREHENSIVE METABOLIC PANEL
ALT: 10 U/L (ref 0–44)
AST: 18 U/L (ref 15–41)
Albumin: 3.7 g/dL (ref 3.5–5.0)
Alkaline Phosphatase: 72 U/L (ref 38–126)
Anion gap: 6 (ref 5–15)
BUN: 9 mg/dL (ref 6–20)
CO2: 26 mmol/L (ref 22–32)
Calcium: 8.9 mg/dL (ref 8.9–10.3)
Chloride: 105 mmol/L (ref 98–111)
Creatinine, Ser: 0.73 mg/dL (ref 0.44–1.00)
GFR, Estimated: >60 mL/min (ref >60)
Glucose, Bld: 111 mg/dL — ABNORMAL HIGH (ref 70–99)
Potassium: 3.5 mmol/L (ref 3.5–5.1)
Sodium: 137 mmol/L (ref 135–145)
Total Bilirubin: 0.6 mg/dL (ref 0.3–1.2)
Total Protein: 7.2 g/dL (ref 6.5–8.1)

## 2020-12-12 LAB — TROPONIN I (HIGH SENSITIVITY)
Troponin I (High Sensitivity): <2 ng/L (ref <18)
Troponin I (High Sensitivity): <2 ng/L (ref <18)

## 2020-12-12 LAB — HCG, QUANTITATIVE, PREGNANCY: hCG, Beta Chain, Quant, S: <1 m[IU]/mL (ref <5)

## 2020-12-12 LAB — LIPASE, BLOOD: Lipase: 23 U/L (ref 11–51)

## 2020-12-12 NOTE — ED Triage Notes (Signed)
Pt states she was driving down the road and started having chest pain and weakness. Pt denies any other symptoms. Pt will barely cooperate with triage questions. Pt will not sit up in the bed.

## 2020-12-12 NOTE — ED Provider Notes (Signed)
[facility] EMERGENCY DEPARTMENT Provider Note   CSN: [id number] Arrival date & time: 12/12/20  3532     History Chief Complaint  Patient presents with  . Chest Pain    Melinda Maldonado is a 30 y.o. female with a history of iron deficient anemia secondary to menorrhagia (although states she is currently 3 weeks late with her menses, home preg test 2 days ago negative), also asthma, anxiety and seizure x 1 in 2018 felt to be stress induces, reporting had a negative EEG, presenting for evaluation of extremity weakness, numbness and sharp chest pain.  She was driving to work when she developed midsternal to right sided chest pain this am in association with shortness of breath and development of weakness and numbness in all 4 extremities. She denies pleuritic pain, palpitations or dizziness, but did have diaphoresis.  She also states her tongue was numb upon arrival but is now resolved.  She did wake this am feeling nauseated, ate breakfast with improvement in this symptom.  Her chest pain is now almost resolved, no sob at this time.    She has had no treatment prior to arrival.  HPI     Past Medical History:  Diagnosis Date  . Allergy   . Anemia   . Anxiety   . Asthma   . Seizures [facility])     Patient Active Problem List   Diagnosis Date Noted  . 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 No pertinent surgical history.   OB History    Gravida  3   Para  1   Term  1   Preterm      AB  2   Living  1     SAB  1   IAB  1   Ectopic      Multiple      Live Births              Family History  Problem Relation Age of Onset  . Asthma Sister   . Asthma Daughter   . Cancer Paternal Grandmother     Social History   Tobacco Use  . Smoking status: Never Smoker  . Smokeless tobacco: Never Used  Vaping Use  . Vaping Use: Never used  Substance Use Topics  . Alcohol use: No  . Drug use: No    Home Medications Prior to Admission medications    Medication Sig Start Date End Date Taking? Authorizing Provider  albuterol (PROVENTIL HFA;VENTOLIN HFA) 108 (90 BASE) MCG/ACT inhaler Inhale 1-2 puffs into the lungs every 4 (four) hours as needed for wheezing or shortness of breath. 07/16/15   Payton Mccallum, MD  ferrous sulfate 325 (65 FE) MG tablet Take by mouth. 08/31/15   [provider]  HYDROcodone-acetaminophen (NORCO/VICODIN) 5-325 MG tablet Take 1 tablet by mouth every 4 (four) hours as needed. 11/05/19   Rancour, Jeannett Senior, MD  metroNIDAZOLE (FLAGYL) 500 MG tablet Take 1 tablet (500 mg total) by mouth 2 (two) times daily. 11/05/19   Rancour, Jeannett Senior, MD  Minocycline HCl (SOLODYN) 65 MG TB24 Take 1 tablet by mouth daily.    [provider]  montelukast (SINGULAIR) 10 MG tablet Take 1 tablet (10 mg total) by mouth at bedtime. 08/30/13   [name], MD  Multiple Vitamin (MULTI-VITAMIN) tablet Take by mouth.    [provider]  nitrofurantoin (MACRODANTIN) 100 MG capsule Take 1 capsule (100 mg total) by mouth 2 (two) times daily. 05/29/17   Sharman Cheek, MD  predniSONE (DELTASONE) 10 MG tablet Take 2 tablets (20 mg total) by mouth daily. Take 4 tablets by mouth daily x5 days 06/12/14   Lorre Nick, MD  Vitamin D, Ergocalciferol, (DRISDOL) 50000 UNITS CAPS Take 100,000 Units by mouth every 7 (seven) days. On Tuesdays    [provider]    Allergies    Keflex [cephalexin], Banana, Eggs or egg-derived products, Latex, and Sulfa antibiotics  Review of Systems   Review of Systems  Constitutional: Negative for chills and fever.  HENT: Negative for congestion.   Eyes: Negative.   Respiratory: Positive for shortness of breath. Negative for chest tightness.   Cardiovascular: Positive for chest pain.  Gastrointestinal: Positive for nausea. Negative for abdominal pain and vomiting.  Genitourinary: Positive for menstrual problem.  Musculoskeletal: Negative for arthralgias, joint swelling and neck  pain.  Skin: Negative.  Negative for rash and wound.  Neurological: Positive for weakness and numbness. Negative for dizziness, light-headedness and headaches.  Psychiatric/Behavioral: Negative.   All other systems reviewed and are negative.   Physical Exam Updated Vital Signs BP (!) 108/59 (BP Location: Right Arm)   Pulse 62   Temp 98.9 F (37.2 C) (Oral)   Resp 16   Ht 5\' 5"  (1.651 m)   Wt 79.4 kg   SpO2 100%   BMI 29.12 kg/m   Physical Exam Vitals and nursing note reviewed.  Constitutional:      Appearance: She is well-developed and well-nourished.  HENT:     Head: Normocephalic and atraumatic.  Eyes:     Conjunctiva/sclera: Conjunctivae normal.  Cardiovascular:     Rate and Rhythm: Normal rate and regular rhythm.     Pulses: Intact distal pulses.     Heart sounds: Normal heart sounds. No murmur heard.   Pulmonary:     Effort: Pulmonary effort is normal. No respiratory distress.     Breath sounds: Normal breath sounds. No decreased breath sounds, wheezing, rhonchi or rales.  Abdominal:     General: Bowel sounds are normal.     Palpations: Abdomen is soft.     Tenderness: There is no abdominal tenderness.  Musculoskeletal:        General: Normal range of motion.     Cervical back: Normal range of motion.     Right lower leg: No tenderness. No edema.     Left lower leg: No tenderness. No edema.  Skin:    General: Skin is warm and dry.  Neurological:     General: No focal deficit present.     Mental Status: She is alert.     Cranial Nerves: Cranial nerves are intact.     Comments: Normal sensation in all 4 extremities.  Unable to flex/ext any large joints of extremities, does wiggle fingers and toes.  2/5 but equal grip strength.  Cranial nerves intact.  Unable to assess gait, heel shin, romberg ram 2ndary to weakness at this time.   Psychiatric:        Mood and Affect: Mood and affect normal.     ED Results / Procedures / Treatments   Labs (all labs  ordered are listed, but only abnormal results are displayed) Labs Reviewed  COMPREHENSIVE METABOLIC PANEL - Abnormal; Notable for the following components:      Result Value   Glucose, Bld 111 (*)    All other components within normal limits  CBC WITH DIFFERENTIAL/PLATELET  HCG, QUANTITATIVE, PREGNANCY  LIPASE, BLOOD  TROPONIN I (HIGH SENSITIVITY)  TROPONIN I (HIGH SENSITIVITY)  EKG EKG Interpretation  Date/Time:  Wednesday December 12 2020 08:54:35 EST Ventricular Rate:  63 PR Interval:    QRS Duration: 90 QT Interval:  411 QTC Calculation: 421 R Axis:   42 Text Interpretation: Sinus rhythm No STEMI Confirmed by Alvester Chou [phone]) on 12/12/2020 12:38:03 PM   Radiology DG Chest Portable 1 View  Result Date: 12/12/2020 CLINICAL DATA:  Chest pain short of breath EXAM: PORTABLE CHEST 1 VIEW COMPARISON:  06/12/2014 FINDINGS: The heart size and mediastinal contours are within normal limits. Both lungs are clear. The visualized skeletal structures are unremarkable. IMPRESSION: No active disease. Electronically Signed   By: Marlan Palau M.D.   On: 12/12/2020 09:45    Procedures Procedures   Medications Ordered in ED Medications - No data to display  ED Course  I have reviewed the triage vital signs and the nursing notes.  Pertinent labs & imaging results that were available during my care of the patient were reviewed by me and considered in my medical decision making (see chart for details).    MDM Rules/Calculators/A&P                         Pt with atypical chest pain and numbness/weakness in all 4 extremities, resolved during ed visit. Labs and imaging, ekg negative. Pt low risk for cardiac source with heart score 1.  Suspect possible hyperventilation/ anxiety panic as source of sx.  She does endorse increased stress with school, work and family, but has been like this for several years.  Encouraged recheck by her pcp for a recheck of sx this week.  Low risk profile,  I do not believe she will benefit by outpt cardiology f/u.  Will defer to pcp.  Final Clinical Impression(s) / ED Diagnoses Final diagnoses:  Atypical chest pain  Extremity numbness    Rx / DC Orders ED Discharge Orders    None       Victoriano Lain 12/13/20 0911    Terald Sleeper, MD 12/13/20 [phone]

## 2020-12-12 NOTE — Discharge Instructions (Addendum)
Your exam, lab tests, ekg and chest xray are normal today with no evidence of your symptoms being due to a cardiac condition.  With numbness and weakness in all of your extremities, it is possibly due to anxiety/panic, however we cannot make this diagnosis in the emergency department (there is no test that can confirm this).   We can identify no emergent medical concerns today.  I advise recheck by your primary provider within the next week for a recheck of your symptoms who can guide you further.

## 2022-11-10 IMAGING — DX DG CHEST 1V PORT
1 series · 1 of 1 positions shown · non-contrast
Comparison: 06/12/2014

CLINICAL DATA: Chest pain short of breath

EXAM:
PORTABLE CHEST 1 VIEW

[chest ap]
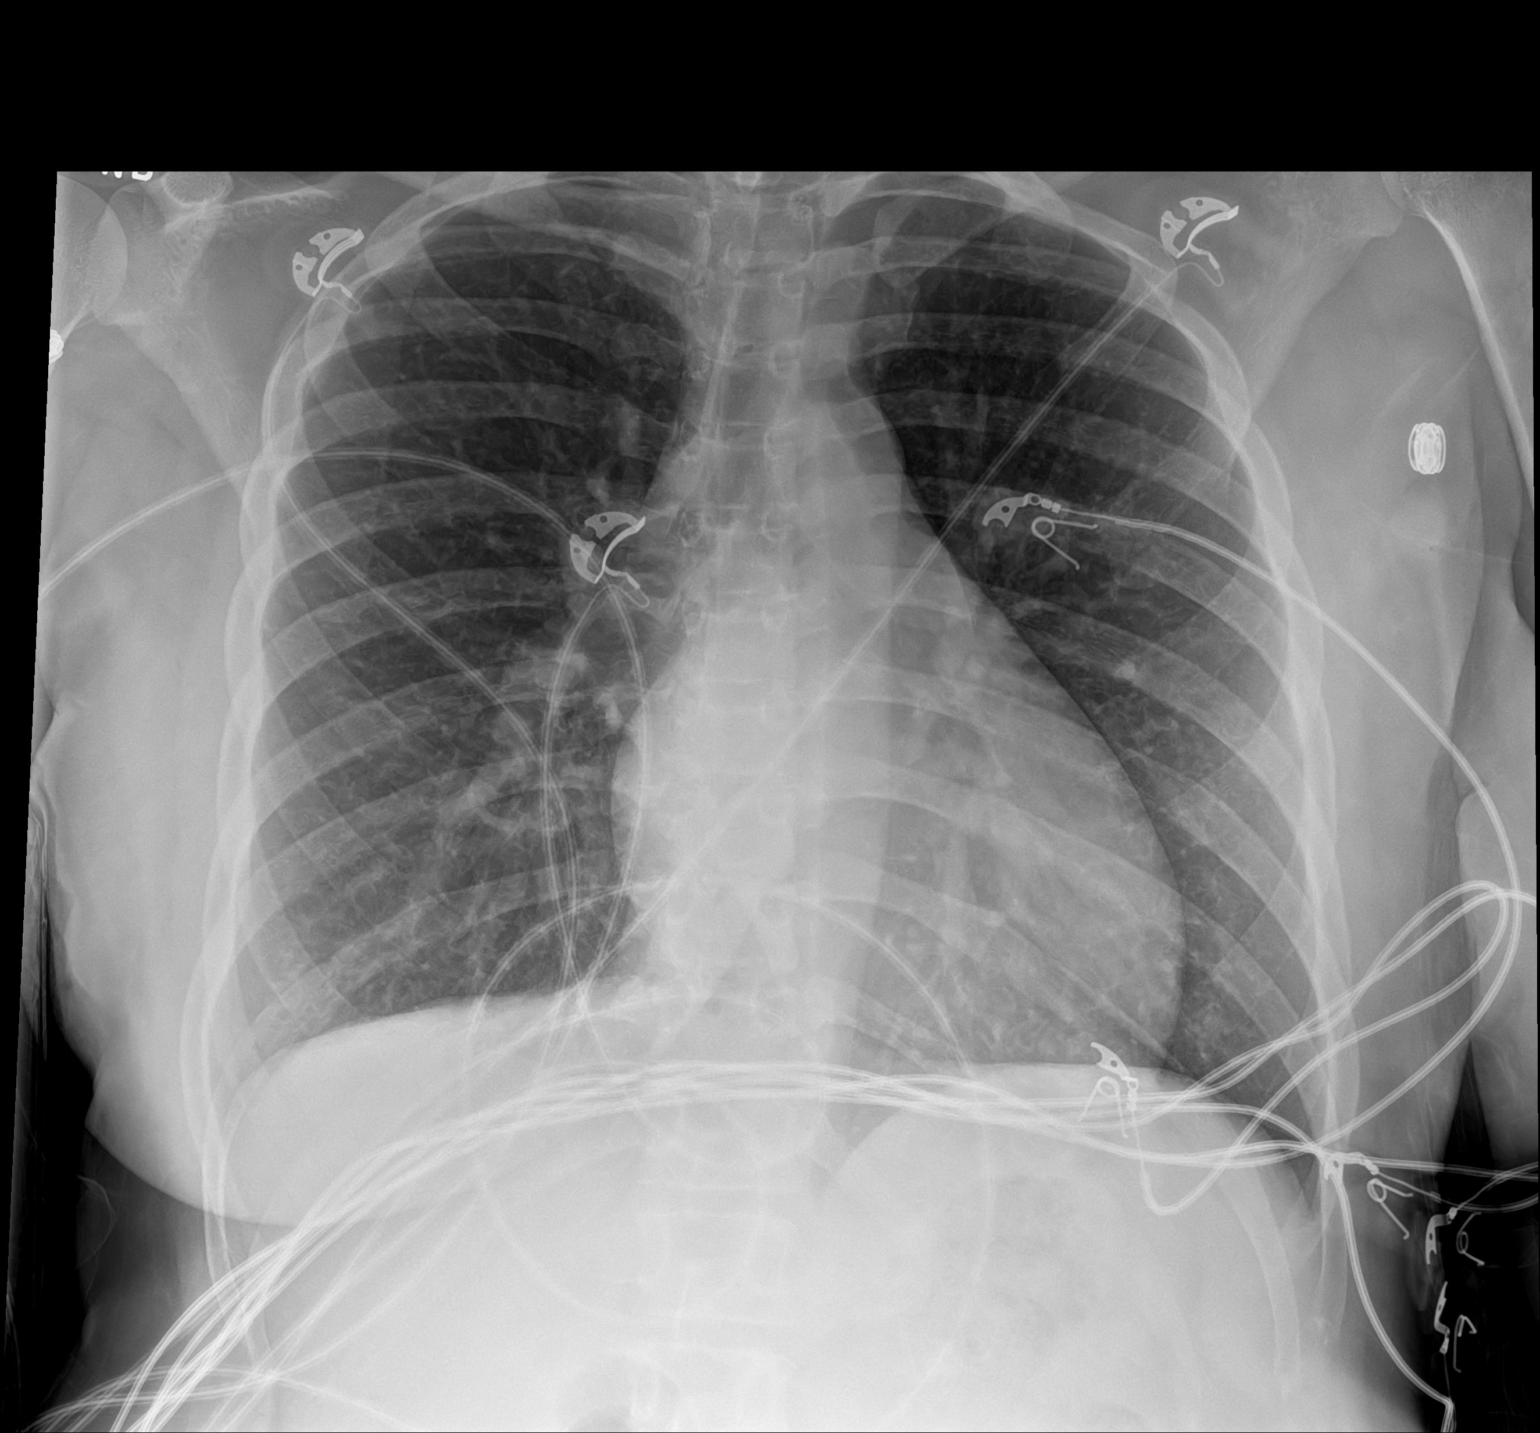

[1 of 1 positions shown; findings below may reference images not displayed]

FINDINGS: The heart size and mediastinal contours are within normal limits.
Both lungs are clear. The visualized skeletal structures are
unremarkable.
IMPRESSION: No active disease.
